# Patient Record
Sex: Male | Born: 1990 | Race: Black or African American | Hispanic: No | Marital: Single | Smoking: Heavy tobacco smoker
Health system: Southern US, Community
[De-identification: ages and names within clinical notes are randomized; demographics above are authoritative.]

## PROBLEM LIST (undated history)

## (undated) DIAGNOSIS — B20 Human immunodeficiency virus [HIV] disease: Secondary | ICD-10-CM

## (undated) DIAGNOSIS — Z21 Asymptomatic human immunodeficiency virus [HIV] infection status: Secondary | ICD-10-CM

---

## 2011-03-02 ENCOUNTER — Emergency Department (HOSPITAL_COMMUNITY)
Admission: EM | Admit: 2011-03-02 | Discharge: 2011-03-03 | Disposition: A | Discharge status: Home / Self Care | Payer: PRIVATE HEALTH INSURANCE | Attending: Emergency Medicine | Admitting: Emergency Medicine

## 2011-03-02 DIAGNOSIS — R509 Fever, unspecified: Secondary | ICD-10-CM | POA: Insufficient documentation

## 2011-03-02 DIAGNOSIS — R599 Enlarged lymph nodes, unspecified: Secondary | ICD-10-CM | POA: Insufficient documentation

## 2011-03-02 DIAGNOSIS — R1031 Right lower quadrant pain: Secondary | ICD-10-CM | POA: Insufficient documentation

## 2011-03-02 DIAGNOSIS — R000 Tachycardia, unspecified: Secondary | ICD-10-CM | POA: Insufficient documentation

## 2011-03-02 DIAGNOSIS — N498 Inflammatory disorders of other specified male genital organs: Secondary | ICD-10-CM | POA: Insufficient documentation

## 2011-03-02 DIAGNOSIS — Z21 Asymptomatic human immunodeficiency virus [HIV] infection status: Secondary | ICD-10-CM | POA: Insufficient documentation

## 2011-03-03 ENCOUNTER — Emergency Department (HOSPITAL_COMMUNITY): Payer: PRIVATE HEALTH INSURANCE

## 2011-03-03 ENCOUNTER — Encounter (HOSPITAL_COMMUNITY): Payer: Self-pay

## 2011-03-03 LAB — BASIC METABOLIC PANEL
BUN: 8 mg/dL (ref 6–23)
CO2: 26 mEq/L (ref 19–32)
Chloride: 99 mEq/L (ref 96–112)
GFR calc non Af Amer: >60 mL/min (ref >60)
Glucose, Bld: 77 mg/dL (ref 70–99)
Potassium: 3.7 mEq/L (ref 3.5–5.1)
Sodium: 135 mEq/L (ref 135–145)

## 2011-03-03 LAB — CBC
HCT: 39.9 % (ref 39.0–52.0)
Hemoglobin: 13 g/dL (ref 13.0–17.0)
MCV: 69.3 fL — ABNORMAL LOW (ref 78.0–100.0)
RBC: 5.76 MIL/uL (ref 4.22–5.81)
RDW: 13.7 % (ref 11.5–15.5)
WBC: 13.3 10*3/uL — ABNORMAL HIGH (ref 4.0–10.5)

## 2011-03-03 LAB — DIFFERENTIAL
Basophils Relative: 0 % (ref 0–1)
Eosinophils Relative: 1 % (ref 0–5)
Monocytes Absolute: 1.7 10*3/uL — ABNORMAL HIGH (ref 0.1–1.0)
Monocytes Relative: 13 % — ABNORMAL HIGH (ref 3–12)
Neutrophils Relative %: 62 % (ref 43–77)

## 2011-03-03 MED ORDER — IOHEXOL 300 MG/ML  SOLN
100.0000 mL | Freq: Once | INTRAMUSCULAR | Status: AC | PRN
  Administered 2011-03-03: 100 mL via INTRAVENOUS

## 2011-03-06 LAB — CULTURE, ROUTINE-ABSCESS

## 2011-03-07 LAB — HIV 1/2 CONFIRMATION: HIV-1 antibody: POSITIVE

## 2011-03-09 NOTE — Consult Note (Signed)
NAMEVALDEZ, Ryan         ACCOUNT NO.:  [id number]  MEDICAL RECORD NO.:  [id number]  LOCATION:  [facility]LED                         FACILITY:  [facility]  PHYSICIAN:  Valetta Fuller, MD    DATE OF BIRTH:  1990-11-22  DATE OF CONSULTATION: DATE OF DISCHARGE:                                CONSULTATION   REASON FOR CONSULTATION:  Scrotal abscess.  HISTORY OF PRESENT ILLNESS:  [name] is 20 years of age.  He tells me that he normally enjoys good health, but apparently has had some "boils" that have spontaneously ruptured in the past.  He presented with 4-5 days of some right-sided scrotal swelling with discomfort, primarily in the last 24 hours.  The patient had fever to 101.4 degrees. No voiding complaints.  He was seen by the emergency room physician who felt that he did have some groin/scrotal induration.  This was primarily at the lateral aspect of his scrotum on the right.  Concerned that there may be an underlying abscess.  Pelvic CT was performed.  The official reading is not yet available, but it does appear that there is a small fluid collection in this area, consistent with an abscess.  The ER physician did not feel comfortable incising this.  Of note, the patient apparently has been diagnosed today with being HIV positive and does apparently have some behaviors that do put him at high risk.  This is a new diagnosis for him.  Of note, his white blood cell count was actually elevated at 13,300.  PAST MEDICAL HISTORY:  Otherwise unremarkable.  No regular medications.  ALLERGIES:  No allergies.  REVIEW OF SYSTEMS:  Otherwise negative.  PHYSICAL EXAMINATION:  GENERAL:  The patient is a well-developed and well-nourished male.  He was quite comfortable, did not appear to be in any distress, and was certainly nontoxic in appearance. VITAL SIGNS:  Temperature was 101.4 degrees.  Blood pressure 140/78 with pulse of 112.  Respiratory effort was normal.  HEART:  Regular  rate and rhythm. ABDOMEN:  Soft, nontender without obvious palpable masses. GU:  Examination of the scrotum revealed approximately a 3 x 3 cm area of induration on the lateral aspect on the right side with a small amount of underlying fluctuance.  No evidence of necrotic process.  This was well removed from the testicular and adnexal structures and both testes and epididymidis palpably were completely normal bilaterally.  PROCEDURE:  The patient underwent discussion about the need for at least minimal incision of this area to rule out some underlying purulence and the rationale for incision and drainage with abscesses.  The patient gave his verbal consent for this.  This area at the junction of the scrotum and groin was prepped and draped in usual manner.  Skin was infiltrated with lidocaine.  A small incision was made in the skin which is noted to be somewhat edematous and indurated.  A small amount of purulent material was obtained and culture was obtained.  The wound was copiously irrigated.  Iodoform packing was then placed.  DISPOSITION:  The patient has had drainage of a small scrotal abscess. We will leave it to the emergency department to place him on antibiotics which will probably be  doxycycline along with pain medication.  Followup will be arranged either through the emergency room or our office for wound check.  They will also discuss with him referral to Infectious Disease for the new diagnosis of HIV positivity.     Valetta Fuller, MD     DSG/MEDQ  D:  03/03/2011  T:  03/03/2011  Job:  [id number]  Electronically Signed by Barron Alvine M.D. on 03/09/2011 06:48:19 PM

## 2011-05-13 ENCOUNTER — Ambulatory Visit (INDEPENDENT_AMBULATORY_CARE_PROVIDER_SITE_OTHER): Payer: PRIVATE HEALTH INSURANCE

## 2011-05-13 DIAGNOSIS — Z23 Encounter for immunization: Secondary | ICD-10-CM

## 2011-05-13 DIAGNOSIS — B20 Human immunodeficiency virus [HIV] disease: Secondary | ICD-10-CM

## 2011-05-14 LAB — CBC WITH DIFFERENTIAL/PLATELET
Basophils Relative: 0 % (ref 0–1)
Eosinophils Absolute: 0.1 10*3/uL (ref 0.0–0.7)
Eosinophils Relative: 2 % (ref 0–5)
Lymphs Abs: 2.5 10*3/uL (ref 0.7–4.0)
MCH: 22.7 pg — ABNORMAL LOW (ref 26.0–34.0)
MCHC: 32.6 g/dL (ref 30.0–36.0)
MCV: 69.6 fL — ABNORMAL LOW (ref 78.0–100.0)
Neutrophils Relative %: 44 % (ref 43–77)
Platelets: 233 10*3/uL (ref 150–400)
RBC: 6.26 MIL/uL — ABNORMAL HIGH (ref 4.22–5.81)

## 2011-05-14 LAB — COMPLETE METABOLIC PANEL WITH GFR
ALT: 16 U/L (ref 0–53)
Alkaline Phosphatase: 74 U/L (ref 39–117)
CO2: 25 mEq/L (ref 19–32)
Creat: 1.02 mg/dL (ref 0.50–1.35)
GFR, Est African American: >89 mL/min
GFR, Est Non African American: >89 mL/min
Glucose, Bld: 73 mg/dL (ref 70–99)
Total Bilirubin: 1 mg/dL (ref 0.3–1.2)

## 2011-05-14 LAB — URINALYSIS
Glucose, UA: NEGATIVE mg/dL
Hgb urine dipstick: NEGATIVE
Ketones, ur: NEGATIVE mg/dL
pH: 7 (ref 5.0–8.0)

## 2011-05-14 LAB — HIV ANTIBODY (ROUTINE TESTING W REFLEX): HIV: REACTIVE

## 2011-05-14 LAB — LIPID PANEL
HDL: 40 mg/dL (ref >39)
LDL Cholesterol: 90 mg/dL (ref 0–99)
Total CHOL/HDL Ratio: 3.6 Ratio
VLDL: 15 mg/dL (ref 0–40)

## 2011-05-14 LAB — HEPATITIS B SURFACE ANTIGEN: Hepatitis B Surface Ag: NEGATIVE

## 2011-05-14 LAB — HEPATITIS B CORE ANTIBODY, TOTAL: Hep B Core Total Ab: NEGATIVE

## 2011-05-14 LAB — HEPATITIS B SURFACE ANTIBODY,QUALITATIVE: Hep B S Ab: NEGATIVE

## 2011-05-15 LAB — HIV-1 RNA ULTRAQUANT REFLEX TO GENTYP+: HIV 1 RNA Quant: 41400 copies/mL — ABNORMAL HIGH (ref <20)

## 2011-05-16 DIAGNOSIS — B20 Human immunodeficiency virus [HIV] disease: Secondary | ICD-10-CM | POA: Insufficient documentation

## 2011-05-16 LAB — HIV 1/2 CONFIRMATION
HIV-1 antibody: POSITIVE — AB
HIV-2 Ab: NEGATIVE

## 2011-05-16 NOTE — Progress Notes (Signed)
Bilateral hand peeling present during intake,(dry scaling) . Pt states it has been present for at least one month and was diagnosed by ED as hand foot and mouth disease. Pt states this is the only place the peeling occurs.  No rashes or peeling present on body per patient report.  Pt refused flu vaccine  Ryan Morrow, RN IV

## 2011-05-26 LAB — HIV-1 GENOTYPR PLUS

## 2011-05-27 ENCOUNTER — Ambulatory Visit: Payer: PRIVATE HEALTH INSURANCE

## 2011-05-27 ENCOUNTER — Encounter: Payer: Self-pay | Admitting: Internal Medicine

## 2011-05-27 ENCOUNTER — Ambulatory Visit (INDEPENDENT_AMBULATORY_CARE_PROVIDER_SITE_OTHER): Payer: Self-pay | Admitting: Internal Medicine

## 2011-05-27 VITALS — BP 146/87 | HR 67 | Temp 97.5°F | Ht 70.0 in | Wt 190.0 lb

## 2011-05-27 DIAGNOSIS — Z23 Encounter for immunization: Secondary | ICD-10-CM

## 2011-05-27 DIAGNOSIS — B20 Human immunodeficiency virus [HIV] disease: Secondary | ICD-10-CM

## 2011-05-27 DIAGNOSIS — Z21 Asymptomatic human immunodeficiency virus [HIV] infection status: Secondary | ICD-10-CM

## 2011-05-27 DIAGNOSIS — R51 Headache: Secondary | ICD-10-CM

## 2011-05-27 MED ORDER — AMITRIPTYLINE HCL 25 MG PO TABS: 25.0000 mg | ORAL_TABLET | Freq: Every day | ORAL | Status: DC

## 2011-05-27 MED ORDER — EMTRICITAB-RILPIVIR-TENOFOV DF 200-25-300 MG PO TABS: 1.0000 | ORAL_TABLET | Freq: Every day | ORAL | Status: DC

## 2011-05-27 NOTE — Progress Notes (Signed)
HIV CLINIC INITIAL VISIT  RFV: establish care after being diagnosed in ED  Subjective:    Patient ID: Ryan Martinez, male    DOB: 1991-05-11, 20 y.o.   MRN: [id number]  HPI Rigdon is a 20 yo AAM with newly diagnosed HIV disease, CD 4 count 580(25%)/ viral load 41,4000, ART naive. His RF include MSM, no IVDU. He states that he previously tested negative within the last year. He has had a new partner since Feb2012, the only person that he has had unprotected sex with. He presented to ED on 03/02/11 for scrotal abscess, which was MRSA+, treated with bactrim. He took his antibiotics without difficulty. He was tested for HIV which was positive rapid test. He has subsequently been referred to [facility] to engage in care. The patient is currently still with the same partner, who he has disclosed his HIV status to, and has asked him to get tested, (not done yet). Orland feels that his partner may already know that his partner is positive, too. Dametrius has not disclosed his status to his family or other friends. He is still processing his disease and wants to start therapy.  Patient's only significant past medical hx is chronic headaches. He gets them 2-3 x per week, takes OTC NSAIDS occasionally. Has had them since he was 55 yo. His mother also has had HA as a child.   All: morphine --rash  Meds: none  Past Medical History: Chronic headache MRSA scrotal abscess Hand-Foot-Mouth disease  SH: lives with his partner. Smokes 1/2 PPD < 20yr. He is interested in quiting. occassional smoking. No illicit drug use. Previously was a Artist, quite 2 months ago. He states that he is not close with his family, but they do reside in [place].  FH: headaches, mother with sickle cell trait, he is unsure of his own status  Review of Systems Review of Systems  Constitutional: Negative for fever, chills, diaphoresis, activity change, appetite change, fatigue and unexpected weight change.  HENT: Negative for  congestion, sore throat, rhinorrhea, sneezing, trouble swallowing and sinus pressure.  Eyes: Negative for photophobia and visual disturbance.  Respiratory: Negative for cough, chest tightness, shortness of breath, wheezing and stridor.  Cardiovascular: Negative for chest pain, palpitations and leg swelling.  Gastrointestinal: Negative for nausea, vomiting, abdominal pain, diarrhea, constipation, blood in stool, abdominal distention and anal bleeding.  Genitourinary: Negative for dysuria, hematuria, flank pain and difficulty urinating.  Musculoskeletal: Negative for myalgias, back pain, joint swelling, arthralgias and gait problem.  Skin: Negative for color change, pallor, rash and wound.  Neurological: Negative for dizziness, tremors, weakness and light-headedness.  Hematological: Negative for adenopathy. Does not bruise/bleed easily.  Psychiatric/Behavioral: Negative for behavioral problems, confusion, sleep disturbance, dysphoric mood, decreased concentration and agitation.       Objective:   Physical Exam BP 146/87  Pulse 67  Temp(Src) 97.5 F (36.4 C) (Oral)  Ht 5\' 10"  (1.778 m)  Wt 190 lb (86.183 kg)  BMI 27.26 kg/m2   General Appearance:    Alert, cooperative, no distress, appears stated age  Head:    Normocephalic, without obvious abnormality, atraumatic  Eyes:    PERRL, conjunctiva/corneas clear, EOM's intact, fundi    benign, both eyes       Ears:    Normal TM's and external ear canals, both ears  Nose:   Nares normal, septum midline, mucosa normal, no drainage   or sinus tenderness  Throat:   Lips, mucosa, and tongue normal; teeth and gums normal  Neck:   Supple, symmetrical, trachea midline, no adenopathy;       thyroid:  No enlargement/tenderness/nodules; no carotid   bruit or JVD  Back:     Symmetric, no curvature, ROM normal, no CVA tenderness  Lungs:     Clear to auscultation bilaterally, respirations unlabored  Chest wall:    No tenderness or deformity  Heart:     Regular rate and rhythm, S1 and S2 normal, no murmur, rub   or gallop  Abdomen:     Soft, non-tender, bowel sounds active all four quadrants,    no masses, no organomegaly        Extremities:   Extremities normal, atraumatic, no cyanosis or edema  Pulses:   2+ and symmetric all extremities  Skin:   Skin color, texture, turgor normal, no rashes or lesions  Lymph nodes:   Cervical, supraclavicular, and axillary nodes normal  Neurologic:   CNII-XII intact. Normal strength, sensation and reflexes      throughout         Assessment & Plan:  HIV = will apply for ADAP. Will start with complera. Patient is not interested at this time to enroll in clinical trials.  Health maintenance = he is to get hep B vax #1, he declined to get influenza vax  Smoking cessation = patient is interested in quiting smoking. Will check in 1 month to see if he has decreased usage  HIV prevention = he is currently not sexually active with his partner. He is instructed to use condoms since he is at risk of transmitting to others since he has HIV with high viral load  Chronic headache = will try amitriptyline 25mg  QHS to see if helps his symptoms  rtc in 1 month

## 2011-07-01 ENCOUNTER — Encounter: Payer: Self-pay | Admitting: Internal Medicine

## 2011-07-01 ENCOUNTER — Ambulatory Visit (INDEPENDENT_AMBULATORY_CARE_PROVIDER_SITE_OTHER): Payer: Medicaid Other | Admitting: Internal Medicine

## 2011-07-01 ENCOUNTER — Ambulatory Visit: Payer: Self-pay

## 2011-07-01 VITALS — BP 149/87 | HR 81 | Temp 98.8°F | Wt 195.0 lb

## 2011-07-01 DIAGNOSIS — Z0000 Encounter for general adult medical examination without abnormal findings: Secondary | ICD-10-CM

## 2011-07-01 DIAGNOSIS — B20 Human immunodeficiency virus [HIV] disease: Secondary | ICD-10-CM

## 2011-07-01 DIAGNOSIS — Z8614 Personal history of Methicillin resistant Staphylococcus aureus infection: Secondary | ICD-10-CM

## 2011-07-01 DIAGNOSIS — Z23 Encounter for immunization: Secondary | ICD-10-CM

## 2011-07-01 DIAGNOSIS — Z21 Asymptomatic human immunodeficiency virus [HIV] infection status: Secondary | ICD-10-CM

## 2011-07-01 MED ORDER — EMTRICITAB-RILPIVIR-TENOFOV DF 200-25-300 MG PO TABS: 1.0000 | ORAL_TABLET | Freq: Every day | ORAL | Status: DC

## 2011-07-02 ENCOUNTER — Other Ambulatory Visit: Payer: Self-pay | Admitting: *Deleted

## 2011-07-02 DIAGNOSIS — B20 Human immunodeficiency virus [HIV] disease: Secondary | ICD-10-CM

## 2011-07-02 MED ORDER — EMTRICITAB-RILPIVIR-TENOFOV DF 200-25-300 MG PO TABS: 1.0000 | ORAL_TABLET | Freq: Every day | ORAL | Status: DC

## 2011-07-07 DIAGNOSIS — Z8614 Personal history of Methicillin resistant Staphylococcus aureus infection: Secondary | ICD-10-CM | POA: Insufficient documentation

## 2011-07-07 NOTE — Progress Notes (Signed)
HIV CLINIC NOTE  RFV: routine follow up Subjective:    Patient ID: Ryan Martinez, male    DOB: [birth year], 21 y.o.   MRN: [id number]  HPIMarques is a 21 yo AAM with newly diagnosed HIV disease, CD 4 count 580(25%)/ viral load 41,4000, ART naive. His RF include MSM, no IVDU. He states that he previously tested negative within the last year. He has had a new partner since Feb2012, the only person that he has had unprotected sex with. He presented to ED on 03/02/11 for scrotal abscess, which was MRSA+, treated with bactrim. He took his antibiotics without difficulty. He was tested for HIV which was positive rapid test.  Patient last seen one month ago, processing his newly diagnosed status. He is ready to initiate therapy. Have offered option to doing clinical trials, but he would like to start ART immediately. We reviewed the importance of not missing any doses, taking his medications routinely.  He states that he was taking amitriptyline for his occasional headaches which are now improved. He still has not disclosed his HIV status to anyone other than his roommate.  Active Ambulatory Problems    Diagnosis Date Noted  . HIV infection, symptomatic, maternal, antepartum 05/16/2011   Resolved Ambulatory Problems    Diagnosis Date Noted  . No Resolved Ambulatory Problems   No Additional Past Medical History   Prior to Admission medications   Medication Sig Start Date End Date Taking? Authorizing Provider  amitriptyline (ELAVIL) 25 MG tablet Take 1 tablet (25 mg total) by mouth at bedtime. 05/27/11 05/26/12 Yes Judyann Munson, MD  Emtricitab-Rilpivir-Tenofovir (COMPLERA) 200-25-300 MG TABS Take 1 tablet by mouth daily. 07/02/11   Judyann Munson, MD     Review of Systems Constitutional: Negative for fever, chills, diaphoresis, activity change, appetite change, fatigue and unexpected weight change.  HENT: Negative for congestion, sore throat, rhinorrhea, sneezing, trouble swallowing and  sinus pressure.  Eyes: Negative for photophobia and visual disturbance.  Respiratory: Negative for cough, chest tightness, shortness of breath, wheezing and stridor.  Cardiovascular: Negative for chest pain, palpitations and leg swelling.  Gastrointestinal: Negative for nausea, vomiting, abdominal pain, diarrhea, constipation, blood in stool, abdominal distention and anal bleeding.  Genitourinary: Negative for dysuria, hematuria, flank pain and difficulty urinating.  Musculoskeletal: Negative for myalgias, back pain, joint swelling, arthralgias and gait problem.  Skin: Negative for color change, pallor, rash and wound.  Neurological: Negative for dizziness, tremors, weakness and light-headedness.  Hematological: Negative for adenopathy. Does not bruise/bleed easily.  Psychiatric/Behavioral: Negative for behavioral problems, confusion, sleep disturbance, dysphoric mood, decreased concentration and agitation.        Objective:   Physical Exam BP 149/87  Pulse 81  Temp(Src) 98.8 F (37.1 C) (Oral)  Wt 195 lb (88.451 kg)  General Appearance:    Alert, cooperative, no distress, appears stated age  Head:    Normocephalic, without obvious abnormality, atraumatic  Eyes:    PERRL, conjunctiva/corneas clear, EOM's intact,   Ears:    Normal TM's and external ear canals, both ears  Nose:   Nares normal, septum midline, mucosa normal, no drainage   or sinus tenderness  Throat:   Lips, mucosa, and tongue normal; teeth and gums normal  Neck:   Supple, symmetrical, trachea midline, no adenopathy;        Back:     Symmetric, no curvature, ROM normal, no CVA tenderness  Lungs:     Clear to auscultation bilaterally, respirations unlabored     Heart:  Regular rate and rhythm, S1 and S2 normal, no murmur, rub   or gallop  Abdomen:     Soft, non-tender, bowel sounds active all four quadrants,    no masses, no organomegaly        Extremities:   Extremities normal, atraumatic, no cyanosis or edema   Pulses:   2+ and symmetric all extremities  Skin:   Skin color, texture, turgor normal, no rashes or lesions  Lymph nodes:   Cervical, supraclavicular, and axillary nodes normal           Assessment & Plan:  HIV= will start patient on complera. 1 tablet daily. Have him come back in 1 month to repeat labs.  Health maintenance= patient to received 2nd dose of hepatitis B vaccination today  Smoking cessation = pre-contemplative  RTC in 1 month

## 2011-08-07 ENCOUNTER — Telehealth: Payer: Self-pay | Admitting: *Deleted

## 2011-08-07 ENCOUNTER — Ambulatory Visit: Payer: Medicaid Other | Admitting: Internal Medicine

## 2011-08-07 NOTE — Telephone Encounter (Signed)
Called patient and he said he was going to have to call back and reschedule his appt because he was at work. Reminded him about the importance of seeing the doctor regularly and encouraged him to call and reschedule asap.

## 2011-12-04 ENCOUNTER — Emergency Department (HOSPITAL_COMMUNITY)
Admission: EM | Admit: 2011-12-04 | Discharge: 2011-12-04 | Disposition: A | Discharge status: Home / Self Care | Payer: Medicaid Other | Attending: Emergency Medicine | Admitting: Emergency Medicine

## 2011-12-04 ENCOUNTER — Encounter (HOSPITAL_COMMUNITY): Payer: Self-pay | Admitting: *Deleted

## 2011-12-04 DIAGNOSIS — L0291 Cutaneous abscess, unspecified: Secondary | ICD-10-CM

## 2011-12-04 DIAGNOSIS — F17200 Nicotine dependence, unspecified, uncomplicated: Secondary | ICD-10-CM | POA: Insufficient documentation

## 2011-12-04 DIAGNOSIS — M79609 Pain in unspecified limb: Secondary | ICD-10-CM | POA: Insufficient documentation

## 2011-12-04 DIAGNOSIS — L03119 Cellulitis of unspecified part of limb: Secondary | ICD-10-CM | POA: Insufficient documentation

## 2011-12-04 DIAGNOSIS — L02419 Cutaneous abscess of limb, unspecified: Secondary | ICD-10-CM | POA: Insufficient documentation

## 2011-12-04 DIAGNOSIS — L0390 Cellulitis, unspecified: Secondary | ICD-10-CM

## 2011-12-04 MED ORDER — SULFAMETHOXAZOLE-TRIMETHOPRIM 800-160 MG PO TABS: 1.0000 | ORAL_TABLET | Freq: Two times a day (BID) | ORAL | Status: AC

## 2011-12-04 MED ORDER — SULFAMETHOXAZOLE-TMP DS 800-160 MG PO TABS
1.0000 | ORAL_TABLET | Freq: Once | ORAL | Status: AC
  Administered 2011-12-04: 1 via ORAL
  Filled 2011-12-04: qty 1

## 2011-12-04 MED ORDER — OXYCODONE-ACETAMINOPHEN 5-325 MG PO TABS
2.0000 | ORAL_TABLET | Freq: Once | ORAL | Status: AC
  Administered 2011-12-04: 2 via ORAL
  Filled 2011-12-04: qty 2

## 2011-12-04 MED ORDER — IBUPROFEN 200 MG PO TABS
400.0000 mg | ORAL_TABLET | Freq: Once | ORAL | Status: AC
  Administered 2011-12-04: 400 mg via ORAL
  Filled 2011-12-04: qty 1
  Filled 2011-12-04: qty 2

## 2011-12-04 NOTE — Discharge Instructions (Signed)
Abscess An abscess (boil or furuncle) is an infected area that contains a collection of pus.  SYMPTOMS Signs and symptoms of an abscess include pain, tenderness, redness, or hardness. You may feel a moveable soft area under your skin. An abscess can occur anywhere in the body.  TREATMENT  A surgical cut (incision) may be made over your abscess to drain the pus. Gauze may be packed into the space or a drain may be looped through the abscess cavity (pocket). This provides a drain that will allow the cavity to heal from the inside outwards. The abscess may be painful for a few days, but should feel much better if it was drained.  Your abscess, if seen early, may not have localized and may not have been drained. If not, another appointment may be required if it does not get better on its own or with medications. HOME CARE INSTRUCTIONS   Only take over-the-counter or prescription medicines for pain, discomfort, or fever as directed by your caregiver.   Take your antibiotics as directed if they were prescribed. Finish them even if you start to feel better.   Keep the skin and clothes clean around your abscess.   If the abscess was drained, you will need to use gauze dressing to collect any draining pus. Dressings will typically need to be changed 3 or more times a day.   The infection may spread by skin contact with others. Avoid skin contact as much as possible.   Practice good hygiene. This includes regular hand washing, cover any draining skin lesions, and do not share personal care items.   If you participate in sports, do not share athletic equipment, towels, whirlpools, or personal care items. Shower after every practice or tournament.   If a draining area cannot be adequately covered:   Do not participate in sports.   Children should not participate in day care until the wound has healed or drainage stops.   If your caregiver has given you a follow-up appointment, it is very important  to keep that appointment. Not keeping the appointment could result in a much worse infection, chronic or permanent injury, pain, and disability. If there is any problem keeping the appointment, you must call back to this facility for assistance.  SEEK MEDICAL CARE IF:   You develop increased pain, swelling, redness, drainage, or bleeding in the wound site.   You develop signs of generalized infection including muscle aches, chills, fever, or a general ill feeling.   You have an oral temperature above 102 F (38.9 C).  MAKE SURE YOU:   Understand these instructions.   Will watch your condition.   Will get help right away if you are not doing well or get worse.  Document Released: 03/05/2005 Document Revised: 05/15/2011 Document Reviewed: 12/28/2007 [facility] Patient Information 2012 [place], [place].Cellulitis Cellulitis is an infection of the tissue under the skin. The infected area is usually red and tender. This is caused by germs. These germs enter the body through cuts or sores. This usually happens in the arms or lower legs. HOME CARE   Take your medicine as told. Finish it even if you start to feel better.   If the infection is on the arm or leg, keep it raised (elevated).   Use a warm cloth on the infected area several times a day.   See your doctor for a follow-up visit as told.  GET HELP RIGHT AWAY IF:   You are tired or confused.   You  throw up (vomit).   You have watery poop (diarrhea).   You feel ill and have muscle aches.   You have a fever.  MAKE SURE YOU:   Understand these instructions.   Will watch your condition.   Will get help right away if you are not doing well or get worse.  Document Released: 11/12/2007 Document Revised: 05/15/2011 Document Reviewed: 04/27/2009 [facility] Patient Information 2012 [place], [place].Abscess Care After An abscess (also called a boil or furuncle) is an infected area that contains a collection of pus. Signs and symptoms  of an abscess include pain, tenderness, redness, or hardness, or you may feel a moveable soft area under your skin. An abscess can occur anywhere in the body. The infection may spread to surrounding tissues causing cellulitis. A cut (incision) by the surgeon was made over your abscess and the pus was drained out. Gauze may have been packed into the space to provide a drain that will allow the cavity to heal from the inside outwards. The boil may be painful for 5 to 7 days. Most people with a boil do not have high fevers. Your abscess, if seen early, may not have localized, and may not have been lanced. If not, another appointment may be required for this if it does not get better on its own or with medications. HOME CARE INSTRUCTIONS   Only take over-the-counter or prescription medicines for pain, discomfort, or fever as directed by your caregiver.   When you bathe, soak and then remove gauze or iodoform packs at least daily or as directed by your caregiver. You may then wash the wound gently with mild soapy water. Repack with gauze or do as your caregiver directs.  SEEK IMMEDIATE MEDICAL CARE IF:   You develop increased pain, swelling, redness, drainage, or bleeding in the wound site.   You develop signs of generalized infection including muscle aches, chills, fever, or a general ill feeling.   An oral temperature above 102 F (38.9 C) develops, not controlled by medication.  See your caregiver for a recheck if you develop any of the symptoms described above. If medications (antibiotics) were prescribed, take them as directed. Document Released: 12/12/2004 Document Revised: 05/15/2011 Document Reviewed: 08/09/2007 [facility] Patient Information 2012 [place], [place].

## 2011-12-04 NOTE — ED Notes (Signed)
Pt states he started to noticed two abscess on his left inner thigh. Pt denies any drainage

## 2011-12-11 NOTE — ED Provider Notes (Signed)
History    21yM with painful lesions to L thigh. Gradual onset. Worsening. Noticed about 2d ago. No trauma. No drainage. NO fever or chills. No n/v. Denies hx of diabetes. No urinary complaints.   CSN: [id number]  Arrival date & time 12/04/11  2115   First MD Initiated Contact with Patient 12/04/11 2234      Chief Complaint  Patient presents with  . Abscess    (Consider location/radiation/quality/duration/timing/severity/associated sxs/prior treatment) HPI  History reviewed. No pertinent past medical history.  History reviewed. No pertinent past surgical history.  Family History  Problem Relation Age of Onset  . Sarcoidosis Mother     History  Substance Use Topics  . Smoking status: Current Everyday Smoker -- 0.5 packs/day for 1 years    Types: Cigarettes  . Smokeless tobacco: Never Used  . Alcohol Use: 0.0 oz/week    1-2 Shots of liquor per week      Review of Systems   Review of symptoms negative unless otherwise noted in HPI.   Allergies  Morphine and related  Home Medications   Current Outpatient Rx  Name Route Sig Dispense Refill  . SULFAMETHOXAZOLE-TRIMETHOPRIM 800-160 MG PO TABS Oral Take 1 tablet by mouth every 12 (twelve) hours. 10 tablet 0    BP 159/93  Pulse 122  Temp 98.8 F (37.1 C)  Resp 20  SpO2 98%  Physical Exam  Nursing note and vitals reviewed. Constitutional: He appears well-developed and well-nourished. No distress.  HENT:  Head: Normocephalic and atraumatic.  Eyes: Conjunctivae are normal. Right eye exhibits no discharge. Left eye exhibits no discharge.  Neck: Neck supple.  Cardiovascular: Normal rate, regular rhythm and normal heart sounds.  Exam reveals no gallop and no friction rub.   No murmur heard. Pulmonary/Chest: Effort normal and breath sounds normal. No respiratory distress.  Abdominal: Soft. He exhibits no distension. There is no tenderness.  Musculoskeletal: He exhibits no edema and no tenderness.  Neurological:  He is alert.  Skin: Skin is warm and dry.       2 discrete lesions consistent with abscess to proximal L medial thigh. Both ~3cm in size. One coming to head with small amount of spontaneous purulent drainage. Fluctuant. Tender. Small area of surrounding cellulitis. Neurovascularly intact distally.  Psychiatric: He has a normal mood and affect. His behavior is normal. Thought content normal.    ED Course  Procedures (including critical care time)  INCISION AND DRAINAGE Performed by: Raeford Razor Consent: Verbal consent obtained. Risks and benefits: risks, benefits and alternatives were discussed Type: abscess  Body area: L medial thigh  Anesthesia: local infiltration  Local anesthetic: lidocaine 2% w epinephrine  Anesthetic total: 2 ml  Complexity: complex Blunt dissection to break up loculations  Drainage: purulent  Drainage amount: moderate   Packing material: 1/4 in iodoform gauze  Patient tolerance: Patient tolerated the procedure well with no immediate complications.  INCISION AND DRAINAGE Performed by: Raeford Razor Consent: Verbal consent obtained. Risks and benefits: risks, benefits and alternatives were discussed Type: abscess  Body area: medial L thigh  Anesthesia: local infiltration  Local anesthetic: lidocaine 2% w epinephrine  Anesthetic total: 2 ml  Complexity: complex Blunt dissection to break up loculations  Drainage: purulent  Drainage amount: moderate  Packing material: 1/4 in iodoform gauze  Patient tolerance: Patient tolerated the procedure well with no immediate complications.      Labs Reviewed - No data to display No results found.   1. Abscess   2. Cellulitis  MDM  21yM with two abscesses to L thigh. I&D'd. Will give course of abx for surrounding cellulitis. Afebrile and well appearing. Not diabetic or other immunocompromising factors. Feel outpt abx appropriate at this time. Continued wound care and return  precautions discussed.        Raeford Razor, MD 12/11/11 [phone]

## 2012-01-23 ENCOUNTER — Other Ambulatory Visit: Payer: Self-pay | Admitting: Licensed Clinical Social Worker

## 2012-01-23 DIAGNOSIS — B20 Human immunodeficiency virus [HIV] disease: Secondary | ICD-10-CM

## 2012-01-26 ENCOUNTER — Telehealth: Payer: Self-pay | Admitting: *Deleted

## 2012-01-26 ENCOUNTER — Other Ambulatory Visit: Payer: Medicaid Other

## 2012-01-26 NOTE — Telephone Encounter (Signed)
Left message to call [facility] for new Lab Work appt.  Ph. # given.

## 2012-02-11 ENCOUNTER — Ambulatory Visit: Payer: Medicaid Other | Admitting: Internal Medicine

## 2012-06-01 ENCOUNTER — Emergency Department (HOSPITAL_COMMUNITY)
Admission: EM | Admit: 2012-06-01 | Discharge: 2012-06-01 | Disposition: A | Discharge status: Home / Self Care | Payer: Self-pay | Attending: Emergency Medicine | Admitting: Emergency Medicine

## 2012-06-01 ENCOUNTER — Encounter (HOSPITAL_COMMUNITY): Payer: Self-pay | Admitting: Emergency Medicine

## 2012-06-01 DIAGNOSIS — B029 Zoster without complications: Secondary | ICD-10-CM | POA: Insufficient documentation

## 2012-06-01 DIAGNOSIS — F17200 Nicotine dependence, unspecified, uncomplicated: Secondary | ICD-10-CM | POA: Insufficient documentation

## 2012-06-01 DIAGNOSIS — R238 Other skin changes: Secondary | ICD-10-CM | POA: Insufficient documentation

## 2012-06-01 DIAGNOSIS — R079 Chest pain, unspecified: Secondary | ICD-10-CM | POA: Insufficient documentation

## 2012-06-01 HISTORY — DX: Human immunodeficiency virus (HIV) disease: B20

## 2012-06-01 HISTORY — DX: Asymptomatic human immunodeficiency virus (hiv) infection status: Z21

## 2012-06-01 MED ORDER — ACYCLOVIR 400 MG PO TABS: 400.0000 mg | ORAL_TABLET | Freq: Four times a day (QID) | ORAL | Status: DC

## 2012-06-01 MED ORDER — OXYCODONE-ACETAMINOPHEN 5-325 MG PO TABS: 1.0000 | ORAL_TABLET | Freq: Four times a day (QID) | ORAL | Status: DC | PRN

## 2012-06-01 NOTE — ED Provider Notes (Signed)
Medical screening examination/treatment/procedure(s) were performed by non-physician practitioner and as supervising physician I was immediately available for consultation/collaboration.  Kellyann Ordway K Linker, MD 06/01/12 1425 

## 2012-06-01 NOTE — ED Provider Notes (Signed)
Medical screening examination/treatment/procedure(s) were performed by non-physician practitioner and as supervising physician I was immediately available for consultation/collaboration.  Ethelda Chick, MD 06/01/12 [phone]

## 2012-06-01 NOTE — ED Notes (Signed)
Pt noticed painful rash to left side since Saturday.

## 2012-06-01 NOTE — ED Provider Notes (Addendum)
History     CSN: [id number]  Arrival date & time 06/01/12  1234   First MD Initiated Contact with Patient 06/01/12 1350      Chief Complaint  Patient presents with  . Rash  . Herpes Zoster    (Consider location/radiation/quality/duration/timing/severity/associated sxs/prior treatment) HPI  21 year old male presents c/o a rash.  Pt reports having a rash to the left side of his chest which wraps around to his back which has been ongoing x 3 days.  Onset is gradual, persistent, moderate in severity, sharp and throbbing, non itchy.  No fever, chills, sob, recent trauma.  No changes in detergent, soap, or other environmental factors.  Had chicken pox as a child. Never had similar rash before. Has hx of HIV.  No treatment tried.   History reviewed. No pertinent past medical history.  History reviewed. No pertinent past surgical history.  Family History  Problem Relation Age of Onset  . Sarcoidosis Mother     History  Substance Use Topics  . Smoking status: Current Every Day Smoker -- 0.5 packs/day for 1 years    Types: Cigarettes  . Smokeless tobacco: Never Used  . Alcohol Use: 0.0 oz/week    1-2 Shots of liquor per week      Review of Systems  Constitutional: Negative for fever and chills.  Respiratory: Negative for shortness of breath.   Cardiovascular: Positive for chest pain.  Musculoskeletal: Negative for arthralgias.  Skin: Positive for rash. Negative for wound.  Neurological: Negative for headaches.    Allergies  Morphine and related  Home Medications  No current outpatient prescriptions on file.  BP 127/73  Pulse 105  Temp 98.7 F (37.1 C) (Oral)  Resp 16  SpO2 100%  Physical Exam  Nursing note and vitals reviewed. Constitutional: He is oriented to person, place, and time. He appears well-developed and well-nourished. No distress.  HENT:  Head: Atraumatic.  Eyes: Conjunctivae normal are normal.  Neck: Neck supple.  Pulmonary/Chest:       Musculoskeletal: Normal range of motion.  Neurological: He is alert and oriented to person, place, and time.  Skin: Skin is warm.    ED Course  Procedures (including critical care time)  Labs Reviewed - No data to display No results found.   No diagnosis found.  1. Shingle, chest  MDM  Pt with hx of HIV currently not on any treatment presents with a 3 days rash consistent with shingle.  Will d/c with acyclovir and percocet.  Resources given for further management. Return precaution discussed.  Pt voice understanding and agrees with plan.    BP 127/73  Pulse 105  Temp 98.7 F (37.1 C) (Oral)  Resp 16  SpO2 100%  I have reviewed nursing notes and vital signs.  I reviewed available ER/hospitalization records thought the EMR         Fayrene Helper, PA-C 06/01/12 1416  Fayrene Helper, PA-C 06/01/12 1419

## 2012-08-14 ENCOUNTER — Encounter (HOSPITAL_COMMUNITY): Payer: Self-pay | Admitting: Emergency Medicine

## 2012-08-14 ENCOUNTER — Telehealth (HOSPITAL_COMMUNITY): Payer: Self-pay | Admitting: Emergency Medicine

## 2012-08-14 ENCOUNTER — Emergency Department (HOSPITAL_COMMUNITY)
Admission: EM | Admit: 2012-08-14 | Discharge: 2012-08-14 | Disposition: A | Discharge status: Home / Self Care | Payer: Self-pay | Attending: Emergency Medicine | Admitting: Emergency Medicine

## 2012-08-14 DIAGNOSIS — K0889 Other specified disorders of teeth and supporting structures: Secondary | ICD-10-CM

## 2012-08-14 DIAGNOSIS — R109 Unspecified abdominal pain: Secondary | ICD-10-CM | POA: Insufficient documentation

## 2012-08-14 DIAGNOSIS — Z21 Asymptomatic human immunodeficiency virus [HIV] infection status: Secondary | ICD-10-CM | POA: Insufficient documentation

## 2012-08-14 DIAGNOSIS — K089 Disorder of teeth and supporting structures, unspecified: Secondary | ICD-10-CM | POA: Insufficient documentation

## 2012-08-14 DIAGNOSIS — F17200 Nicotine dependence, unspecified, uncomplicated: Secondary | ICD-10-CM | POA: Insufficient documentation

## 2012-08-14 MED ORDER — MAGIC MOUTHWASH W/LIDOCAINE: 10.0000 mL | Freq: Three times a day (TID) | ORAL | Status: DC | PRN

## 2012-08-14 MED ORDER — TRAMADOL HCL 50 MG PO TABS: 50.0000 mg | ORAL_TABLET | Freq: Four times a day (QID) | ORAL | Status: DC | PRN

## 2012-08-14 NOTE — ED Notes (Signed)
Patient c/o gum pain throughout that began about a week ago.  Patient denies fevers.

## 2012-08-14 NOTE — ED Provider Notes (Signed)
History     CSN: [id number]  Arrival date & time 08/14/12  0807   First MD Initiated Contact with Patient 08/14/12 [phone]      Chief Complaint  Patient presents with  . Dental Pain    (Consider location/radiation/quality/duration/timing/severity/associated sxs/prior treatment) HPI  Ryan Martinez is a 22 y.o. male complaining of severe abdominal pain at the intersection with the teeth worsening over the course of 3 days. He rates his pain at 10 out of 10; he has not taken any pain medication. He states that he could not sleep last night secondary to pain he denies any fever, difficulty swallowing, lesions or plaques. Patient has HIV he is in the process of obtaining a primary care physician last CD4 count and viral is unknown.  Past Medical History  Diagnosis Date  . HIV (human immunodeficiency virus infection)     History reviewed. No pertinent past surgical history.  Family History  Problem Relation Age of Onset  . Sarcoidosis Mother     History  Substance Use Topics  . Smoking status: Current Every Day Smoker -- 0.50 packs/day for 1 years    Types: Cigarettes  . Smokeless tobacco: Never Used  . Alcohol Use: 0.0 oz/week    1-2 Shots of liquor per week      Review of Systems  Constitutional: Negative for fever.  HENT: Positive for dental problem.   Respiratory: Negative for shortness of breath.   Cardiovascular: Negative for chest pain.  Gastrointestinal: Negative for nausea, vomiting, abdominal pain and diarrhea.  All other systems reviewed and are negative.    Allergies  Morphine and related  Home Medications   Current Outpatient Rx  Name  Route  Sig  Dispense  Refill  . acyclovir (ZOVIRAX) 400 MG tablet   Oral   Take 1 tablet (400 mg total) by mouth 4 (four) times daily.   50 tablet   0   . oxyCODONE-acetaminophen (PERCOCET/ROXICET) 5-325 MG per tablet   Oral   Take 1-2 tablets by mouth every 6 (six) hours as needed for pain.   20 tablet    0     BP 151/90  Pulse 104  Temp(Src) 99 F (37.2 C) (Oral)  Resp 16  SpO2 99%  Physical Exam  Nursing note and vitals reviewed. Constitutional: He is oriented to person, place, and time. He appears well-developed and well-nourished. No distress.  HENT:  Head: Normocephalic.  Mouth/Throat: Oropharynx is clear and moist.  Dentition in generally good condition with no large dental caries. Pt is most tender on upper inner gumline: an rea with no swelling but with plague and bleeding gums.   No plaques or legions  Eyes: Conjunctivae and EOM are normal.  Cardiovascular: Normal rate.   Pulmonary/Chest: Effort normal. No stridor.  Musculoskeletal: Normal range of motion.  Lymphadenopathy:    He has no cervical adenopathy.  Neurological: He is alert and oriented to person, place, and time.  Psychiatric: He has a normal mood and affect.    ED Course  Procedures (including critical care time)  Labs Reviewed - No data to display No results found.   1. Pain, dental       MDM   Ryan Martinez is a 22 y.o. male with dental pain. No sign of infectious process. Magic mouthwash with nystatin (verbally with pharmacy) given. Advised Pt to follow with dentist and, more importantly, with PCP or ID for HIV managment   Filed Vitals:   08/14/12 0815  BP:  151/90  Pulse: 104  Temp: 99 F (37.2 C)  TempSrc: Oral  Resp: 16  SpO2: 99%     Pt verbalized understanding and agrees with care plan. Outpatient follow-up and return precautions given.    Discharge Medication List as of 08/14/2012  8:56 AM    START taking these medications   Details  Alum & Mag Hydroxide-Simeth (MAGIC MOUTHWASH W/LIDOCAINE) SOLN Take 10 mLs by mouth 3 (three) times daily as needed., Starting 08/14/2012, Until Discontinued, Print    traMADol (ULTRAM) 50 MG tablet Take 1 tablet (50 mg total) by mouth every 6 (six) hours as needed for pain., Starting 08/14/2012, Until Discontinued, [employer], PA-C 08/14/12 1610

## 2012-08-15 NOTE — ED Provider Notes (Signed)
Medical screening examination/treatment/procedure(s) were performed by non-physician practitioner and as supervising physician I was immediately available for consultation/collaboration.  Douglas Delo, MD 08/15/12 1540 

## 2012-09-02 ENCOUNTER — Other Ambulatory Visit: Payer: Self-pay | Admitting: *Deleted

## 2012-09-02 DIAGNOSIS — Z113 Encounter for screening for infections with a predominantly sexual mode of transmission: Secondary | ICD-10-CM

## 2012-09-02 DIAGNOSIS — Z79899 Other long term (current) drug therapy: Secondary | ICD-10-CM

## 2012-09-02 DIAGNOSIS — B20 Human immunodeficiency virus [HIV] disease: Secondary | ICD-10-CM

## 2012-09-08 ENCOUNTER — Other Ambulatory Visit: Payer: Self-pay | Admitting: *Deleted

## 2012-09-08 ENCOUNTER — Other Ambulatory Visit (INDEPENDENT_AMBULATORY_CARE_PROVIDER_SITE_OTHER): Payer: Self-pay

## 2012-09-08 ENCOUNTER — Other Ambulatory Visit: Payer: Self-pay | Admitting: Infectious Diseases

## 2012-09-08 ENCOUNTER — Ambulatory Visit: Payer: Medicaid Other

## 2012-09-08 ENCOUNTER — Other Ambulatory Visit: Payer: Medicaid Other

## 2012-09-08 DIAGNOSIS — B20 Human immunodeficiency virus [HIV] disease: Secondary | ICD-10-CM

## 2012-09-08 DIAGNOSIS — Z79899 Other long term (current) drug therapy: Secondary | ICD-10-CM

## 2012-09-08 DIAGNOSIS — Z113 Encounter for screening for infections with a predominantly sexual mode of transmission: Secondary | ICD-10-CM

## 2012-09-08 LAB — CBC WITH DIFFERENTIAL/PLATELET
Basophils Relative: 0 % (ref 0–1)
HCT: 41.8 % (ref 39.0–52.0)
Hemoglobin: 13.3 g/dL (ref 13.0–17.0)
MCHC: 31.8 g/dL (ref 30.0–36.0)
Monocytes Absolute: 0.7 10*3/uL (ref 0.1–1.0)
Monocytes Relative: 17 % — ABNORMAL HIGH (ref 3–12)
Neutro Abs: 1.6 10*3/uL — ABNORMAL LOW (ref 1.7–7.7)

## 2012-09-08 LAB — COMPLETE METABOLIC PANEL WITH GFR
Albumin: 4 g/dL (ref 3.5–5.2)
Alkaline Phosphatase: 86 U/L (ref 39–117)
BUN: 10 mg/dL (ref 6–23)
CO2: 28 mEq/L (ref 19–32)
Calcium: 9.2 mg/dL (ref 8.4–10.5)
Chloride: 105 mEq/L (ref 96–112)
GFR, Est African American: >89 mL/min
GFR, Est Non African American: >89 mL/min
Glucose, Bld: 75 mg/dL (ref 70–99)
Potassium: 4.2 mEq/L (ref 3.5–5.3)

## 2012-09-08 LAB — LIPID PANEL: Cholesterol: 123 mg/dL (ref 0–200)

## 2012-09-08 MED ORDER — EMTRICITAB-RILPIVIR-TENOFOV DF 200-25-300 MG PO TABS: 1.0000 | ORAL_TABLET | Freq: Every day | ORAL | Status: DC

## 2012-09-08 NOTE — Telephone Encounter (Signed)
Printed for patient ADAP application.

## 2012-09-09 LAB — HIV-1 RNA QUANT-NO REFLEX-BLD: HIV-1 RNA Quant, Log: 4.74 {Log} — ABNORMAL HIGH (ref <1.30)

## 2012-09-09 LAB — T-HELPER CELL (CD4) - (RCID CLINIC ONLY)
CD4 % Helper T Cell: 24 % — ABNORMAL LOW (ref 33–55)
CD4 T Cell Abs: 470 uL (ref 400–2700)

## 2012-09-14 ENCOUNTER — Encounter: Payer: Self-pay | Admitting: *Deleted

## 2012-09-16 ENCOUNTER — Ambulatory Visit (INDEPENDENT_AMBULATORY_CARE_PROVIDER_SITE_OTHER): Payer: Self-pay | Admitting: Internal Medicine

## 2012-09-16 VITALS — Temp 98.3°F | Wt 195.0 lb

## 2012-09-16 DIAGNOSIS — B20 Human immunodeficiency virus [HIV] disease: Secondary | ICD-10-CM

## 2012-09-16 NOTE — Progress Notes (Signed)
[facility] NOTE  RFV: routine hiv visit Subjective:    Patient ID: Ryan Martinez, male    DOB: Jun 02, 1991, 22 y.o.   MRN: [id number]  HPI [name], with  22yo AAM with newly HIV disease, CD 4 count 470/ VL 55, 580, not taking complera due to lack of insurance. He was started on complera for 3 months but subsequently lost insurance and unable to afford it. He has now signed up for adap.   Feels like he is coming down a cold with sore throat yesterday, with body aches and chills x 2 days. No sick contact.  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   Review of Systems Other than uri symptoms, 10 point ROS is negative    Objective:   Physical Exam Temp(Src) 98.3 F (36.8 C) (Oral)  Wt 195 lb (88.451 kg)  BMI 27.98 kg/m2 Physical Exam  Constitutional: He is oriented to person, place, and time. He appears well-developed and well-nourished. No distress.  HENT:  Mouth/Throat: Oropharynx is clear and moist. No oropharyngeal exudate.  Cardiovascular: Normal rate, regular rhythm and normal heart sounds. Exam reveals no gallop and no friction rub.  No murmur heard.  Pulmonary/Chest: Effort normal and breath sounds normal. No respiratory distress. He has no wheezes.  Abdominal: Soft. Bowel sounds are normal. He exhibits no distension. There is no tenderness.  Lymphadenopathy:  He has no cervical adenopathy.  Neurological: He is alert and oriented to person, place, and time.  Skin: Skin is warm and dry. No rash noted. No erythema.  Psychiatric: He has a normal mood and affect. His behavior is normal.      Assessment & Plan:  HIV = will restart complera once adap is in place  Sharon symptoms = most likely viral, continue with supportive care and recommend over the counter meds  rtc in 4-6 wks

## 2012-09-22 ENCOUNTER — Other Ambulatory Visit: Payer: Self-pay | Admitting: *Deleted

## 2012-09-22 DIAGNOSIS — B20 Human immunodeficiency virus [HIV] disease: Secondary | ICD-10-CM

## 2012-09-22 MED ORDER — EMTRICITAB-RILPIVIR-TENOFOV DF 200-25-300 MG PO TABS: 1.0000 | ORAL_TABLET | Freq: Every day | ORAL | Status: DC

## 2012-10-30 IMAGING — CT CT PELVIS W/ CM
1 of 2 series · 15 of 32 positions shown, 19 images · IV contrast (omnipaque)
Comparison: None.

CLINICAL DATA: Right groin pain, radiating to the scrotum.  Fever
and leukocytosis.  Assess for abscess.

CT PELVIS WITH CONTRAST
TECHNIQUE: Multidetector CT imaging of the pelvis was performed
using the standard protocol following the bolus administration of
intravenous contrast.
Contrast:  100 mL of Omnipaque 300 IV contrast

[Series 2: pelvis with · axial · 0.60mm/px · z∈[-375,-140]mm · 15 of 53 slices shown, 19 images]
[im 3/53  soft-tissue]
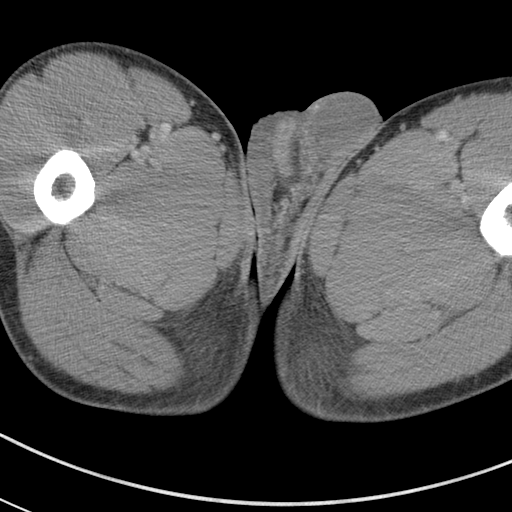
[im 3/53  bone]
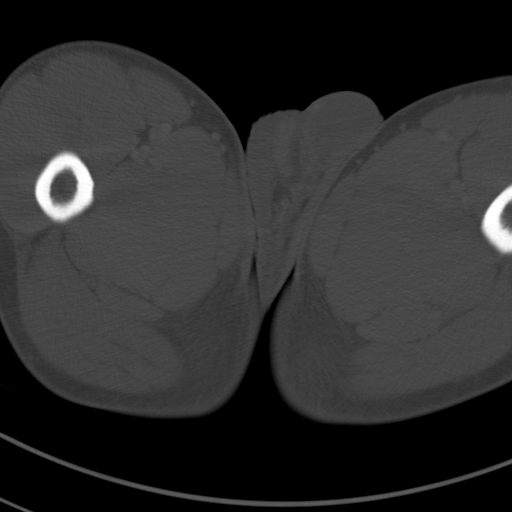
[im 7/53  soft-tissue]
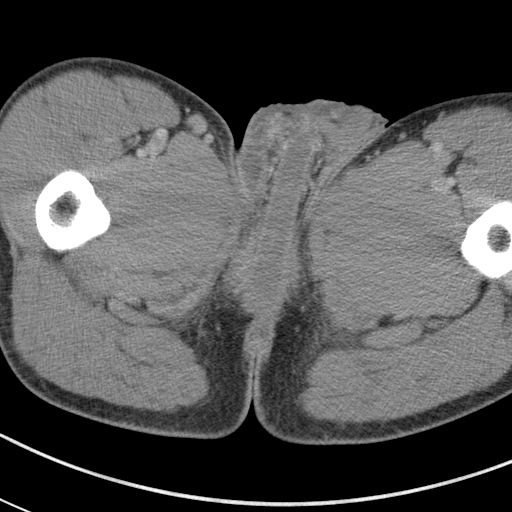
[im 11/53  soft-tissue]
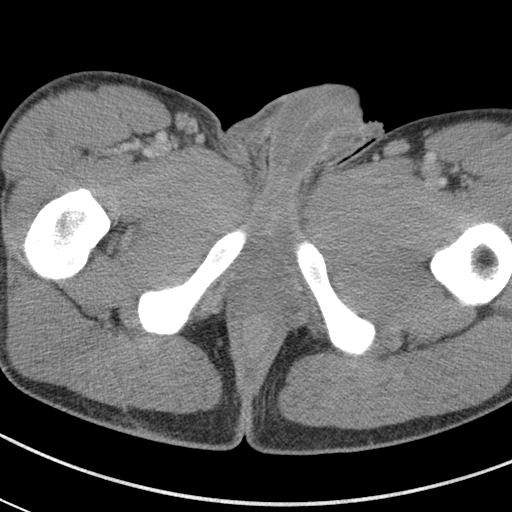
[im 16/53  soft-tissue]
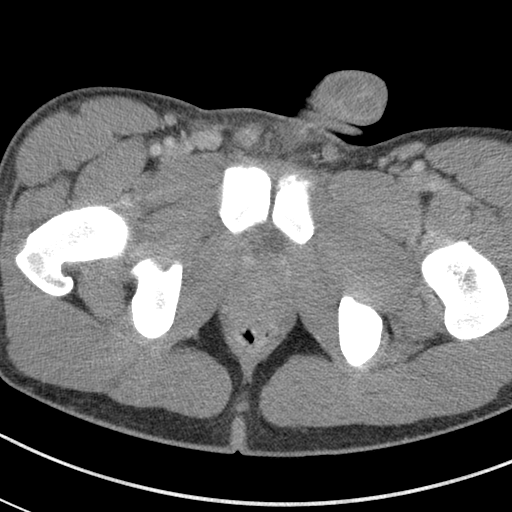
[im 18/53  soft-tissue]
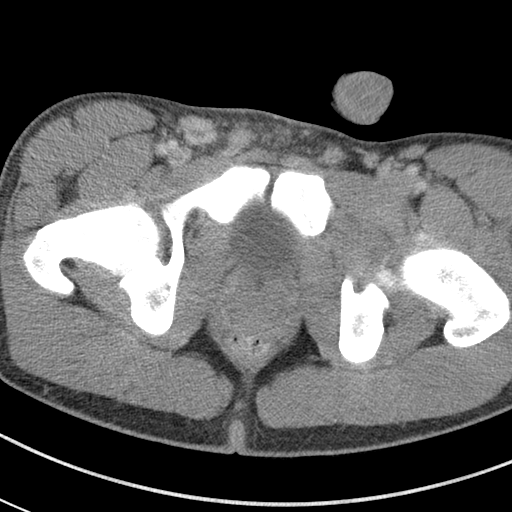
[im 22/53  soft-tissue]
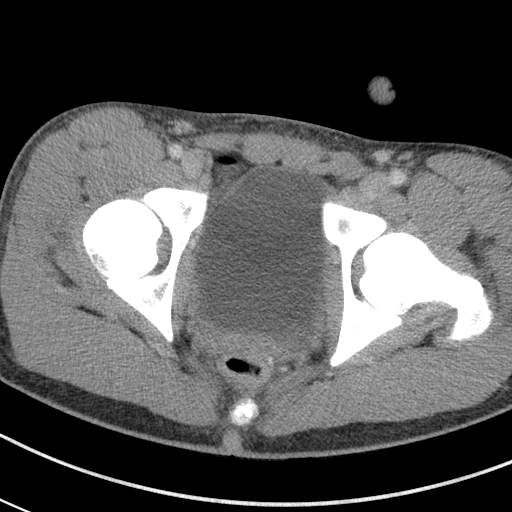
[im 27/53  soft-tissue]
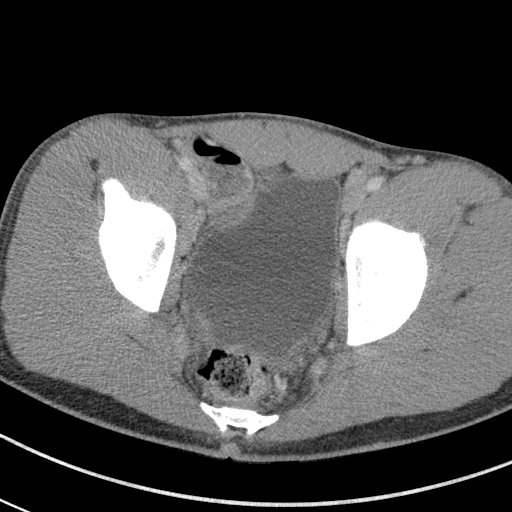
[im 31/53  soft-tissue]
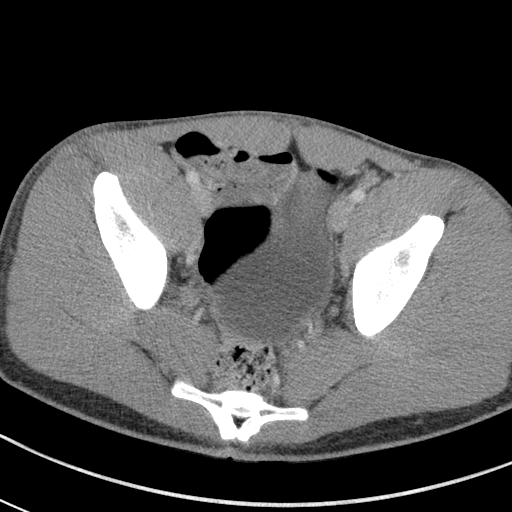
[im 35/53  soft-tissue]
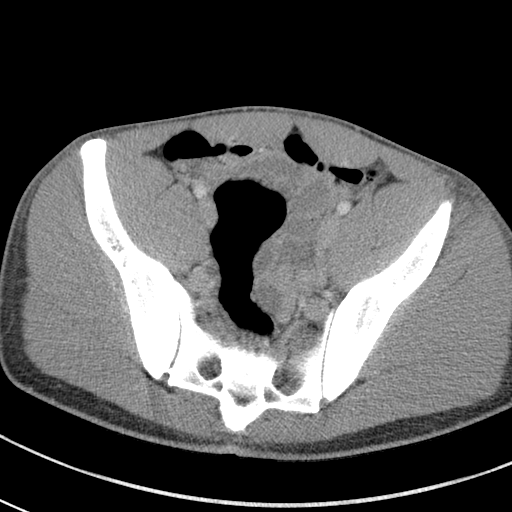
[im 35/53  bone]
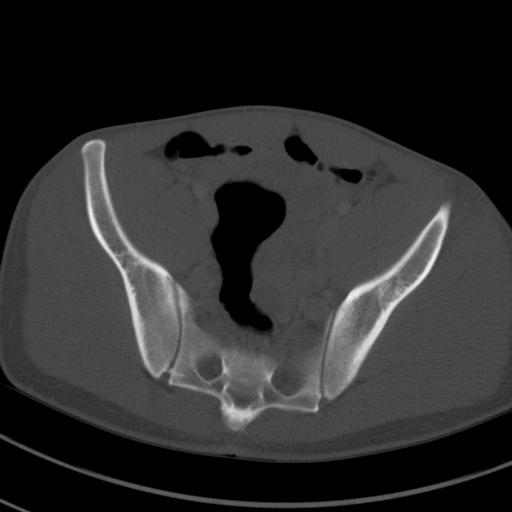
[im 37/53  soft-tissue]
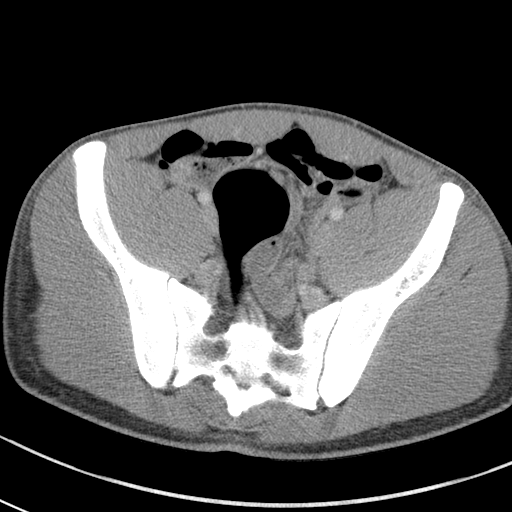
[im 42/53  soft-tissue]
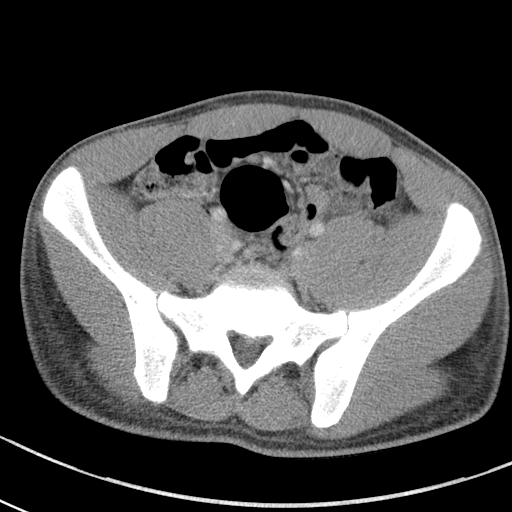
[im 44/53  lung]
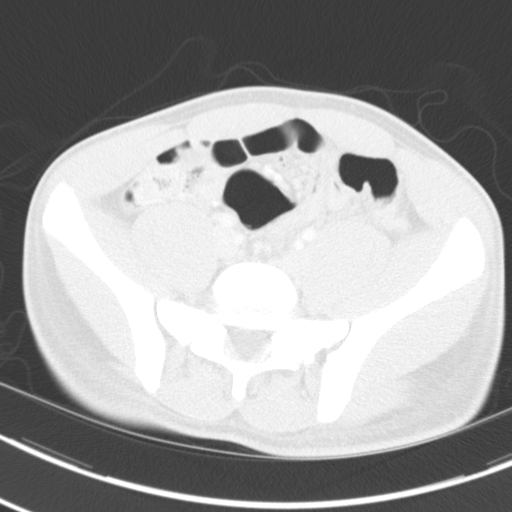
[im 46/53  soft-tissue]
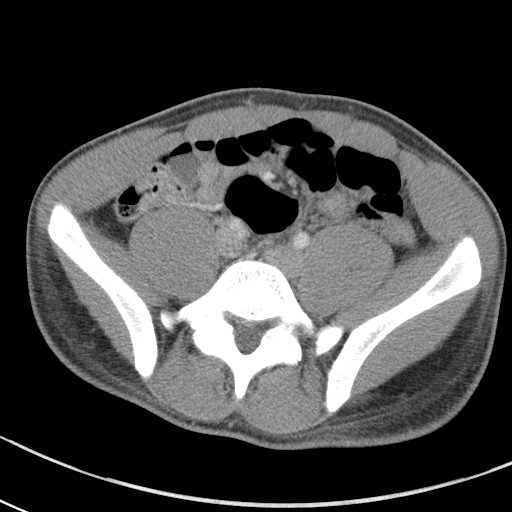
[im 46/53  lung]
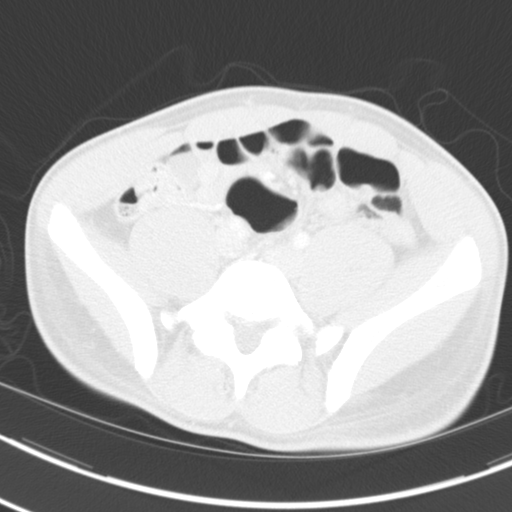
[im 48/53  lung]
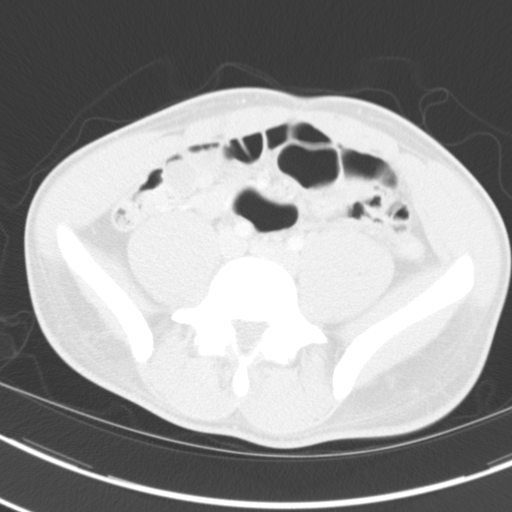
[im 50/53  soft-tissue]
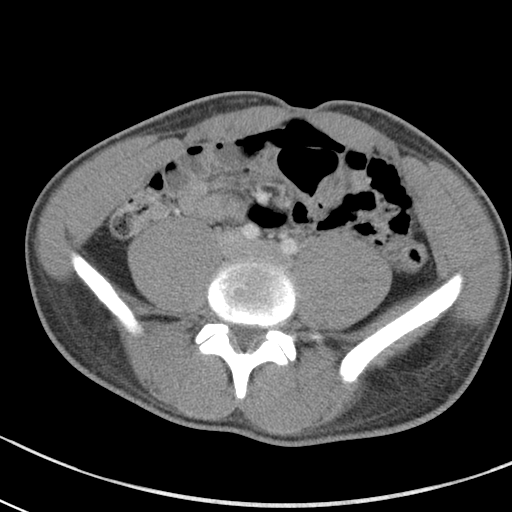
[im 50/53  lung]
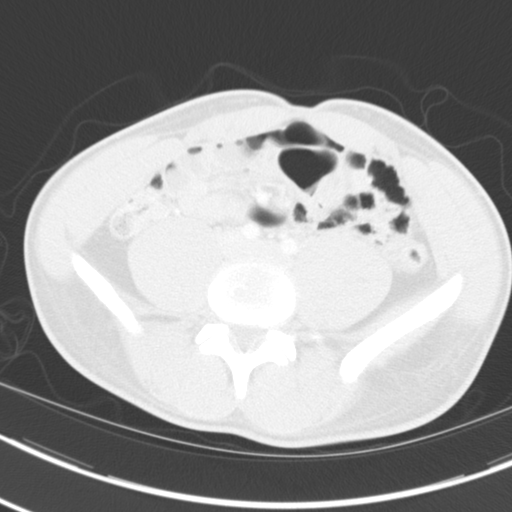

[15 of 32 positions shown; findings below may reference images not displayed]

FINDINGS: There is a small focal peripherally enhancing abscess
identified at the superior lateral right side of the scrotum,
measuring approximately 3.0 x 2.6 x 1.4 cm, just deep to the skin
surface.  There is no evidence of extension of the abscess.

Diffuse soft tissue edema is noted about the right scrotal sac.  No
significant hydrocele is seen.  The testes appear relatively
symmetric and are grossly unremarkable.

Mildly enlarged right inguinal nodes are noted, measuring up to
cm in short axis.  Associated soft tissue stranding is noted within
the right inguinal region, reflecting mild inflammation.

The soft tissues of the pelvis are grossly unremarkable.  The
visualized portions of the small large bowel are grossly
unremarkable.  The bladder is moderately distended and appears
grossly unremarkable; prominent vasculature about the bladder
likely reflects diffuse pelvic hyperemia.  The prostate remains
normal in size. No acute vascular abnormalities are otherwise
characterized.

No acute osseous abnormalities are seen.
IMPRESSION: 1.  Small focal abscess at the superior lateral right side of the
scrotum, measuring 3.0 x 2.6 x 1.4 cm, just deep to the skin
surface.  No evidence of extension of the abscess.
2.  Diffuse soft tissue edema noted about the right side of the
scrotal sac.
3.  Mild right inguinal lymphadenopathy noted.  Soft tissue
stranding extends to the right inguinal region, reflecting mild
inflammation.
4.  Diffuse pelvic hyperemia noted about the bladder.

## 2012-12-30 ENCOUNTER — Other Ambulatory Visit: Payer: Self-pay | Admitting: Internal Medicine

## 2012-12-30 DIAGNOSIS — B20 Human immunodeficiency virus [HIV] disease: Secondary | ICD-10-CM

## 2013-01-10 ENCOUNTER — Other Ambulatory Visit: Payer: Self-pay

## 2013-01-10 ENCOUNTER — Ambulatory Visit: Payer: Self-pay

## 2013-01-14 ENCOUNTER — Other Ambulatory Visit: Payer: Self-pay

## 2013-01-25 ENCOUNTER — Ambulatory Visit: Payer: Self-pay | Admitting: Internal Medicine

## 2013-01-25 ENCOUNTER — Other Ambulatory Visit: Payer: Self-pay

## 2013-01-26 ENCOUNTER — Other Ambulatory Visit (INDEPENDENT_AMBULATORY_CARE_PROVIDER_SITE_OTHER): Payer: Self-pay

## 2013-01-26 DIAGNOSIS — B20 Human immunodeficiency virus [HIV] disease: Secondary | ICD-10-CM

## 2013-01-26 LAB — COMPREHENSIVE METABOLIC PANEL
ALT: 13 U/L (ref 0–53)
Albumin: 4.3 g/dL (ref 3.5–5.2)
Alkaline Phosphatase: 79 U/L (ref 39–117)
CO2: 26 mEq/L (ref 19–32)
Glucose, Bld: 79 mg/dL (ref 70–99)
Potassium: 4.2 mEq/L (ref 3.5–5.3)
Sodium: 138 mEq/L (ref 135–145)
Total Protein: 8 g/dL (ref 6.0–8.3)

## 2013-01-27 LAB — CBC WITH DIFFERENTIAL/PLATELET
Hemoglobin: 14.8 g/dL (ref 13.0–17.0)
Lymphs Abs: 2.3 10*3/uL (ref 0.7–4.0)
Monocytes Relative: 13 % — ABNORMAL HIGH (ref 3–12)
Neutro Abs: 2.5 10*3/uL (ref 1.7–7.7)
Neutrophils Relative %: 45 % (ref 43–77)
Platelets: 266 10*3/uL (ref 150–400)
RBC: 6.36 MIL/uL — ABNORMAL HIGH (ref 4.22–5.81)
WBC: 5.6 10*3/uL (ref 4.0–10.5)

## 2013-01-27 LAB — HIV-1 RNA QUANT-NO REFLEX-BLD
HIV 1 RNA Quant: <20 copies/mL (ref <20)
HIV-1 RNA Quant, Log: <1.3 {Log} (ref <1.30)

## 2013-02-21 ENCOUNTER — Ambulatory Visit: Payer: Self-pay | Admitting: Internal Medicine

## 2013-02-25 ENCOUNTER — Encounter: Payer: Self-pay | Admitting: *Deleted

## 2013-02-25 NOTE — Progress Notes (Signed)
Patient ID: Ryan Martinez, male   DOB: Sep 04, 1990, 22 y.o.   MRN: [id number] Updated RW information.

## 2013-03-01 ENCOUNTER — Ambulatory Visit (INDEPENDENT_AMBULATORY_CARE_PROVIDER_SITE_OTHER): Payer: Self-pay | Admitting: Internal Medicine

## 2013-03-01 ENCOUNTER — Encounter: Payer: Self-pay | Admitting: Internal Medicine

## 2013-03-01 VITALS — BP 131/77 | HR 61 | Temp 98.3°F | Wt 214.0 lb

## 2013-03-01 DIAGNOSIS — B20 Human immunodeficiency virus [HIV] disease: Secondary | ICD-10-CM

## 2013-03-01 MED ORDER — EMTRICITAB-RILPIVIR-TENOFOV DF 200-25-300 MG PO TABS: 1.0000 | ORAL_TABLET | Freq: Every day | ORAL | Status: DC

## 2013-03-01 NOTE — Progress Notes (Signed)
[facility] NOTE  RFV: routine Subjective:    Patient ID: Ryan Martinez, male    DOB: 1990-10-26, 22 y.o.   MRN: [id number]  HPI  [name], CD 4 count 800/VL<20 on complera doing well. Not missing dose. He reports had a bout of shingles last year.  In cosmetology school, part-time.  Starting in a new relationship x 1 month. Older [name] HIV-.  Current Outpatient Prescriptions on File Prior to Visit  Medication Sig Dispense Refill  . acyclovir (ZOVIRAX) 400 MG tablet Take 1 tablet (400 mg total) by mouth 4 (four) times daily.  50 tablet  0  . Alum & Mag Hydroxide-Simeth (MAGIC MOUTHWASH W/LIDOCAINE) SOLN Take 10 mLs by mouth 3 (three) times daily as needed.  350 mL  0  . Emtricitab-Rilpivir-Tenofovir 200-25-300 MG TABS Take 1 tablet by mouth daily.  30 tablet  5  . oxyCODONE-acetaminophen (PERCOCET/ROXICET) 5-325 MG per tablet Take 1-2 tablets by mouth every 6 (six) hours as needed for pain.  20 tablet  0  . traMADol (ULTRAM) 50 MG tablet Take 1 tablet (50 mg total) by mouth every 6 (six) hours as needed for pain.  15 tablet  0   No current facility-administered medications on file prior to visit.   Active Ambulatory Problems    Diagnosis Date Noted  . Human immunodeficiency virus (HIV) disease 05/16/2011  . History of MRSA infection 07/07/2011   Resolved Ambulatory Problems    Diagnosis Date Noted  . No Resolved Ambulatory Problems   Past Medical History  Diagnosis Date  . HIV (human immunodeficiency virus infection)    History  Substance Use Topics  . Smoking status: Current Every Day Smoker -- 0.50 packs/day for 1 years    Types: Cigarettes  . Smokeless tobacco: Never Used  . Alcohol Use: 0.0 oz/week    1-2 Shots of liquor per week  family history includes Sarcoidosis in his mother.  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   Review of Systems  Constitutional: Negative for fever, chills, diaphoresis, activity change, appetite change, fatigue  and unexpected weight change.  HENT: Negative for congestion, sore throat, rhinorrhea, sneezing, trouble swallowing and sinus pressure.  Eyes: Negative for photophobia and visual disturbance.  Respiratory: Negative for cough, chest tightness, shortness of breath, wheezing and stridor.  Cardiovascular: Negative for chest pain, palpitations and leg swelling.  Gastrointestinal: Negative for nausea, vomiting, abdominal pain, diarrhea, constipation, blood in stool, abdominal distention and anal bleeding.  Genitourinary: Negative for dysuria, hematuria, flank pain and difficulty urinating.  Musculoskeletal: Negative for myalgias, back pain, joint swelling, arthralgias and gait problem.  Skin: Negative for color change, pallor, rash and wound.  Neurological: Negative for dizziness, tremors, weakness and light-headedness.  Hematological: Negative for adenopathy. Does not bruise/bleed easily.  Psychiatric/Behavioral: Negative for behavioral problems, confusion, sleep disturbance, dysphoric mood, decreased concentration and agitation.      Objective:   Physical Exam BP 131/77  Pulse 61  Temp(Src) 98.3 F (36.8 C) (Oral)  Wt 214 lb (97.07 kg)  BMI 30.71 kg/m2 Physical Exam  Constitutional: He is oriented to person, place, and time. He appears well-developed and well-nourished. No distress.  HENT:  Mouth/Throat: Oropharynx is clear and moist. No oropharyngeal exudate.  Cardiovascular: Normal rate, regular rhythm and normal heart sounds. Exam reveals no gallop and no friction rub.  No murmur heard.  Pulmonary/Chest: Effort normal and breath sounds normal. No respiratory distress. He has no wheezes.  Abdominal: Soft. Bowel sounds  are normal. He exhibits no distension. There is no tenderness.  Lymphadenopathy:  He has no cervical adenopathy.  Neurological: He is alert and oriented to person, place, and time.  Skin: Skin is warm and dry. No rash noted. No erythema.  Psychiatric: He has a normal  mood and affect. His behavior is normal.          Assessment & Plan:  hiv = will give refill for complera  Health maintenance = declined flu shot. Never had the flu shot before.  rtc  3months

## 2013-03-07 ENCOUNTER — Ambulatory Visit: Payer: Self-pay

## 2013-05-31 ENCOUNTER — Encounter: Payer: Self-pay | Admitting: Internal Medicine

## 2013-05-31 ENCOUNTER — Ambulatory Visit (INDEPENDENT_AMBULATORY_CARE_PROVIDER_SITE_OTHER): Payer: Self-pay | Admitting: Internal Medicine

## 2013-05-31 VITALS — BP 141/79 | HR 93 | Temp 98.6°F | Ht 70.0 in | Wt 220.0 lb

## 2013-05-31 DIAGNOSIS — Z22322 Carrier or suspected carrier of Methicillin resistant Staphylococcus aureus: Secondary | ICD-10-CM

## 2013-05-31 DIAGNOSIS — Z23 Encounter for immunization: Secondary | ICD-10-CM

## 2013-05-31 DIAGNOSIS — B20 Human immunodeficiency virus [HIV] disease: Secondary | ICD-10-CM

## 2013-05-31 LAB — COMPREHENSIVE METABOLIC PANEL
Albumin: 4.4 g/dL (ref 3.5–5.2)
Alkaline Phosphatase: 110 U/L (ref 39–117)
BUN: 12 mg/dL (ref 6–23)
CO2: 30 mEq/L (ref 19–32)
Calcium: 9.4 mg/dL (ref 8.4–10.5)
Chloride: 101 mEq/L (ref 96–112)
Glucose, Bld: 66 mg/dL — ABNORMAL LOW (ref 70–99)
Potassium: 4 mEq/L (ref 3.5–5.3)
Sodium: 138 mEq/L (ref 135–145)
Total Protein: 8.2 g/dL (ref 6.0–8.3)

## 2013-05-31 LAB — CBC
HCT: 44 % (ref 39.0–52.0)
Hemoglobin: 14.4 g/dL (ref 13.0–17.0)
MCHC: 32.7 g/dL (ref 30.0–36.0)
RBC: 6.23 MIL/uL — ABNORMAL HIGH (ref 4.22–5.81)

## 2013-05-31 MED ORDER — MUPIROCIN 2 % EX OINT: 1.0000 "application " | TOPICAL_OINTMENT | Freq: Three times a day (TID) | CUTANEOUS | Status: DC

## 2013-05-31 MED ORDER — CHLORHEXIDINE GLUCONATE 4 % EX LIQD: Freq: Every day | CUTANEOUS | Status: DC

## 2013-05-31 NOTE — Progress Notes (Signed)
   Subjective:    Patient ID: Ryan Martinez, male    DOB: 07-18-1990, 22 y.o.   MRN: [id number]  HPI [name] with HIV, CD 4 count of 800/VL<20 (aug) on complera. Doing well. Not missing doses. He reports having recurrent skin abscesses but has no problem currently  Current Outpatient Prescriptions on File Prior to Visit  Medication Sig Dispense Refill  . Emtricitab-Rilpivir-Tenofovir 200-25-300 MG TABS Take 1 tablet by mouth daily.  30 tablet  5   No current facility-administered medications on file prior to visit.   Active Ambulatory Problems    Diagnosis Date Noted  . Human immunodeficiency virus (HIV) disease 05/16/2011  . History of MRSA infection 07/07/2011   Resolved Ambulatory Problems    Diagnosis Date Noted  . No Resolved Ambulatory Problems   Past Medical History  Diagnosis Date  . HIV (human immunodeficiency virus infection)      Review of Systems  Constitutional: Negative for fever, chills, diaphoresis, activity change, appetite change, fatigue and unexpected weight change.  HENT: Negative for congestion, sore throat, rhinorrhea, sneezing, trouble swallowing and sinus pressure.  Eyes: Negative for photophobia and visual disturbance.  Respiratory: Negative for cough, chest tightness, shortness of breath, wheezing and stridor.  Cardiovascular: Negative for chest pain, palpitations and leg swelling.  Gastrointestinal: Negative for nausea, vomiting, abdominal pain, diarrhea, constipation, blood in stool, abdominal distention and anal bleeding.  Genitourinary: Negative for dysuria, hematuria, flank pain and difficulty urinating.  Musculoskeletal: Negative for myalgias, back pain, joint swelling, arthralgias and gait problem.  Skin: recurrent skin abscess Neurological: Negative for dizziness, tremors, weakness and light-headedness.  Hematological: Negative for adenopathy. Does not bruise/bleed easily.  Psychiatric/Behavioral: Negative for behavioral problems,  confusion, sleep disturbance, dysphoric mood, decreased concentration and agitation.       Objective:   Physical Exam BP 141/79  Pulse 93  Temp(Src) 98.6 F (37 C) (Oral)  Ht 5\' 10"  (1.778 m)  Wt 220 lb (99.791 kg)  BMI 31.57 kg/m2 Physical Exam  Constitutional: He is oriented to person, place, and time. He appears well-developed and well-nourished. No distress.  HENT:  Mouth/Throat: Oropharynx is clear and moist. No oropharyngeal exudate.  Cardiovascular: Normal rate, regular rhythm and normal heart sounds. Exam reveals no gallop and no friction rub.  No murmur heard.  Pulmonary/Chest: Effort normal and breath sounds normal. No respiratory distress. He has no wheezes.  Abdominal: Soft. Bowel sounds are normal. He exhibits no distension. There is no tenderness.  Lymphadenopathy:  He has no cervical adenopathy.  Neurological: He is alert and oriented to person, place, and time.  Skin: Skin is warm and dry. No rash noted. No erythema.  Psychiatric: He has a normal mood and affect. His behavior is normal.         Assessment & Plan:  hiv = well controlled. Will check labs today.   mrsa skin infection = will do a trial of decolonization with hibiclens and mupirocin  Health maintenance = will give 3rd dose of hep b and  flu vax  rtc in 3months

## 2013-06-01 LAB — T-HELPER CELL (CD4) - (RCID CLINIC ONLY)
CD4 % Helper T Cell: 34 % (ref 33–55)
CD4 T Cell Abs: 700 /uL (ref 400–2700)

## 2013-06-03 LAB — HIV-1 RNA QUANT-NO REFLEX-BLD: HIV-1 RNA Quant, Log: <1.3 {Log} (ref <1.30)

## 2013-08-30 ENCOUNTER — Ambulatory Visit: Payer: Self-pay

## 2013-08-30 ENCOUNTER — Encounter: Payer: Self-pay | Admitting: Internal Medicine

## 2013-08-30 ENCOUNTER — Ambulatory Visit (INDEPENDENT_AMBULATORY_CARE_PROVIDER_SITE_OTHER): Payer: Self-pay | Admitting: Internal Medicine

## 2013-08-30 VITALS — BP 135/85 | HR 82 | Temp 97.2°F | Wt 215.0 lb

## 2013-08-30 DIAGNOSIS — Z23 Encounter for immunization: Secondary | ICD-10-CM

## 2013-08-30 DIAGNOSIS — Z113 Encounter for screening for infections with a predominantly sexual mode of transmission: Secondary | ICD-10-CM

## 2013-08-30 DIAGNOSIS — B20 Human immunodeficiency virus [HIV] disease: Secondary | ICD-10-CM

## 2013-08-30 NOTE — Progress Notes (Signed)
   Subjective:    Patient ID: Ryan Martinez, male    DOB: Mar 30, 1991, 23 y.o.   MRN: [id number]  HPI 700/VL<20, on complera, does well with adherence but occ takes it without food. Has some nasal congestion today which he attributes to change in weather. Otherwise in good health.  Current Outpatient Prescriptions on File Prior to Visit  Medication Sig Dispense Refill  . chlorhexidine (HIBICLENS) 4 % external liquid Apply topically daily. X 14 days  120 mL  0  . Emtricitab-Rilpivir-Tenofovir 200-25-300 MG TABS Take 1 tablet by mouth daily.  30 tablet  5  . mupirocin ointment (BACTROBAN) 2 % Place 1 application into the nose 3 (three) times daily. X 14 days. Can do generic  22 g  0   No current facility-administered medications on file prior to visit.   Active Ambulatory Problems    Diagnosis Date Noted  . Human immunodeficiency virus (HIV) disease 05/16/2011  . History of MRSA infection 07/07/2011   Resolved Ambulatory Problems    Diagnosis Date Noted  . No Resolved Ambulatory Problems   Past Medical History  Diagnosis Date  . HIV (human immunodeficiency virus infection)      Review of Systems  Constitutional: Negative for fever, chills, diaphoresis, activity change, appetite change, fatigue and unexpected weight change.  HENT: Negative for congestion, sore throat, rhinorrhea, sneezing, trouble swallowing and sinus pressure.  Eyes: Negative for photophobia and visual disturbance.  Respiratory: Negative for cough, chest tightness, shortness of breath, wheezing and stridor.  Cardiovascular: Negative for chest pain, palpitations and leg swelling.  Gastrointestinal: Negative for nausea, vomiting, abdominal pain, diarrhea, constipation, blood in stool, abdominal distention and anal bleeding.  Genitourinary: Negative for dysuria, hematuria, flank pain and difficulty urinating.  Musculoskeletal: Negative for myalgias, back pain, joint swelling, arthralgias and gait problem.    Skin: Negative for color change, pallor, rash and wound.  Neurological: Negative for dizziness, tremors, weakness and light-headedness.  Hematological: Negative for adenopathy. Does not bruise/bleed easily.  Psychiatric/Behavioral: Negative for behavioral problems, confusion, sleep disturbance, dysphoric mood, decreased concentration and agitation.       Objective:   Physical Exam  BP 135/85  Pulse 82  Temp(Src) 97.2 F (36.2 C) (Oral)  Wt 215 lb (97.523 kg)  Physical Exam  Constitutional: He is oriented to person, place, and time. He appears well-developed and well-nourished. No distress. .  Neurological: He is alert and oriented to person, place, and time.  Skin: Skin is warm and dry. No rash noted. No erythema.  Psychiatric: He has a normal mood and affect. His behavior is normal.       Assessment & Plan:  hiv =well controlled. Will check labs today. Plus rpr. Need adap reapproval  Health maintenance = will give hep a #1  rtc in 6 months

## 2013-08-31 ENCOUNTER — Telehealth: Payer: Self-pay | Admitting: *Deleted

## 2013-08-31 LAB — T.PALLIDUM AB, TOTAL: T PALLIDUM ANTIBODIES (TP-PA): 7.24 {s_co_ratio} — AB (ref <0.90)

## 2013-08-31 LAB — RPR TITER

## 2013-08-31 LAB — RPR: RPR: REACTIVE — AB

## 2013-08-31 NOTE — Telephone Encounter (Signed)
Relayed test results.  Scheduled patient for the next 3 Thursdays at 2:00 for injections.  Pt verbalized understanding. Andree CossHowell, Eugean Arnott M, RN

## 2013-08-31 NOTE — Telephone Encounter (Signed)
Message copied by Andree CossHOWELL, Liela Rylee M on Wed Aug 31, 2013  5:07 PM ------      Message from: Judyann MunsonSNIDER, CYNTHIA      Created: Wed Aug 31, 2013  3:47 PM       i might have emailed u about this already. Can u arrange to have him come in for 3 wkly bicillin inj. thx ------

## 2013-09-01 ENCOUNTER — Ambulatory Visit (INDEPENDENT_AMBULATORY_CARE_PROVIDER_SITE_OTHER): Payer: Self-pay | Admitting: *Deleted

## 2013-09-01 DIAGNOSIS — A539 Syphilis, unspecified: Secondary | ICD-10-CM

## 2013-09-01 LAB — T-HELPER CELL (CD4) - (RCID CLINIC ONLY)
CD4 % Helper T Cell: 29 % — ABNORMAL LOW (ref 33–55)
CD4 T CELL ABS: 520 /uL (ref 400–2700)

## 2013-09-01 MED ORDER — PENICILLIN G BENZATHINE 1200000 UNIT/2ML IM SUSP
1.2000 10*6.[IU] | Freq: Once | INTRAMUSCULAR | Status: AC
  Administered 2013-09-01: 1.2 10*6.[IU] via INTRAMUSCULAR

## 2013-09-02 LAB — HIV-1 RNA QUANT-NO REFLEX-BLD: HIV 1 RNA Quant: <20 copies/mL (ref <20)

## 2013-09-08 ENCOUNTER — Ambulatory Visit (INDEPENDENT_AMBULATORY_CARE_PROVIDER_SITE_OTHER): Payer: Self-pay | Admitting: Licensed Clinical Social Worker

## 2013-09-08 ENCOUNTER — Ambulatory Visit: Payer: Self-pay

## 2013-09-08 ENCOUNTER — Encounter: Payer: Self-pay | Admitting: *Deleted

## 2013-09-08 DIAGNOSIS — A539 Syphilis, unspecified: Secondary | ICD-10-CM

## 2013-09-08 MED ORDER — PENICILLIN G BENZATHINE 1200000 UNIT/2ML IM SUSP
1.2000 10*6.[IU] | Freq: Once | INTRAMUSCULAR | Status: AC
  Administered 2013-09-08: 1.2 10*6.[IU] via INTRAMUSCULAR

## 2013-09-08 NOTE — Progress Notes (Signed)
Patient tolerated injections well, will return next week for last injection

## 2013-09-13 ENCOUNTER — Other Ambulatory Visit: Payer: Self-pay | Admitting: *Deleted

## 2013-09-13 DIAGNOSIS — B20 Human immunodeficiency virus [HIV] disease: Secondary | ICD-10-CM

## 2013-09-13 MED ORDER — EMTRICITAB-RILPIVIR-TENOFOV DF 200-25-300 MG PO TABS: 1.0000 | ORAL_TABLET | Freq: Every day | ORAL | Status: DC

## 2013-09-15 ENCOUNTER — Ambulatory Visit (INDEPENDENT_AMBULATORY_CARE_PROVIDER_SITE_OTHER): Payer: Self-pay | Admitting: *Deleted

## 2013-09-15 DIAGNOSIS — A539 Syphilis, unspecified: Secondary | ICD-10-CM

## 2013-09-15 MED ORDER — PENICILLIN G BENZATHINE 1200000 UNIT/2ML IM SUSP
1.2000 10*6.[IU] | Freq: Once | INTRAMUSCULAR | Status: AC
  Administered 2013-09-15: 1.2 10*6.[IU] via INTRAMUSCULAR

## 2013-09-26 ENCOUNTER — Telehealth: Payer: Self-pay | Admitting: *Deleted

## 2013-09-26 NOTE — Telephone Encounter (Signed)
Faxed application to  [facility] for third time.  Keeps denying for missing date of doctor's signature.

## 2013-12-23 ENCOUNTER — Other Ambulatory Visit: Payer: Self-pay | Admitting: Internal Medicine

## 2013-12-23 ENCOUNTER — Other Ambulatory Visit: Payer: Self-pay | Admitting: *Deleted

## 2013-12-23 DIAGNOSIS — B20 Human immunodeficiency virus [HIV] disease: Secondary | ICD-10-CM

## 2014-01-17 ENCOUNTER — Other Ambulatory Visit: Payer: Self-pay

## 2014-01-27 ENCOUNTER — Other Ambulatory Visit (INDEPENDENT_AMBULATORY_CARE_PROVIDER_SITE_OTHER): Payer: Self-pay

## 2014-01-27 DIAGNOSIS — B20 Human immunodeficiency virus [HIV] disease: Secondary | ICD-10-CM

## 2014-01-27 DIAGNOSIS — Z79899 Other long term (current) drug therapy: Secondary | ICD-10-CM

## 2014-01-27 DIAGNOSIS — Z113 Encounter for screening for infections with a predominantly sexual mode of transmission: Secondary | ICD-10-CM

## 2014-01-27 LAB — COMPREHENSIVE METABOLIC PANEL
ALT: 16 U/L (ref 0–53)
AST: 17 U/L (ref 0–37)
Albumin: 4.3 g/dL (ref 3.5–5.2)
Alkaline Phosphatase: 69 U/L (ref 39–117)
BUN: 15 mg/dL (ref 6–23)
CO2: 28 mEq/L (ref 19–32)
Calcium: 9 mg/dL (ref 8.4–10.5)
Chloride: 103 mEq/L (ref 96–112)
Creat: 1.09 mg/dL (ref 0.50–1.35)
Glucose, Bld: 122 mg/dL — ABNORMAL HIGH (ref 70–99)
POTASSIUM: 4.3 meq/L (ref 3.5–5.3)
Sodium: 139 mEq/L (ref 135–145)
TOTAL PROTEIN: 7.3 g/dL (ref 6.0–8.3)
Total Bilirubin: 0.7 mg/dL (ref 0.2–1.2)

## 2014-01-27 LAB — CBC WITH DIFFERENTIAL/PLATELET
BASOS ABS: 0.1 10*3/uL (ref 0.0–0.1)
Basophils Relative: 1 % (ref 0–1)
Eosinophils Absolute: 0.2 10*3/uL (ref 0.0–0.7)
Eosinophils Relative: 3 % (ref 0–5)
HEMATOCRIT: 45.3 % (ref 39.0–52.0)
Hemoglobin: 15.2 g/dL (ref 13.0–17.0)
LYMPHS ABS: 2.9 10*3/uL (ref 0.7–4.0)
Lymphocytes Relative: 46 % (ref 12–46)
MCH: 23.7 pg — ABNORMAL LOW (ref 26.0–34.0)
MCHC: 33.6 g/dL (ref 30.0–36.0)
MCV: 70.6 fL — ABNORMAL LOW (ref 78.0–100.0)
Monocytes Absolute: 0.6 10*3/uL (ref 0.1–1.0)
Monocytes Relative: 9 % (ref 3–12)
NEUTROS ABS: 2.6 10*3/uL (ref 1.7–7.7)
Neutrophils Relative %: 41 % — ABNORMAL LOW (ref 43–77)
PLATELETS: 254 10*3/uL (ref 150–400)
RBC: 6.42 MIL/uL — ABNORMAL HIGH (ref 4.22–5.81)
RDW: 14 % (ref 11.5–15.5)
WBC: 6.3 10*3/uL (ref 4.0–10.5)

## 2014-01-27 LAB — LIPID PANEL
Cholesterol: 144 mg/dL (ref 0–200)
HDL: 36 mg/dL — ABNORMAL LOW (ref >39)
LDL CALC: 78 mg/dL (ref 0–99)
Total CHOL/HDL Ratio: 4 Ratio
Triglycerides: 151 mg/dL — ABNORMAL HIGH (ref <150)
VLDL: 30 mg/dL (ref 0–40)

## 2014-01-27 LAB — T-HELPER CELL (CD4) - (RCID CLINIC ONLY)
CD4 T CELL HELPER: 39 % (ref 33–55)
CD4 T Cell Abs: 1170 /uL (ref 400–2700)

## 2014-01-31 ENCOUNTER — Ambulatory Visit: Payer: Self-pay | Admitting: Internal Medicine

## 2014-01-31 LAB — HIV-1 RNA QUANT-NO REFLEX-BLD
HIV 1 RNA Quant: <20 copies/mL (ref <20)
HIV-1 RNA Quant, Log: <1.3 {Log} (ref <1.30)

## 2014-02-08 ENCOUNTER — Ambulatory Visit: Payer: Self-pay | Admitting: Internal Medicine

## 2014-02-22 ENCOUNTER — Ambulatory Visit (INDEPENDENT_AMBULATORY_CARE_PROVIDER_SITE_OTHER): Payer: Self-pay | Admitting: Internal Medicine

## 2014-02-22 ENCOUNTER — Encounter: Payer: Self-pay | Admitting: Internal Medicine

## 2014-02-22 VITALS — BP 144/79 | HR 84 | Temp 98.3°F | Ht 71.0 in | Wt 227.0 lb

## 2014-02-22 DIAGNOSIS — A530 Latent syphilis, unspecified as early or late: Secondary | ICD-10-CM

## 2014-02-22 DIAGNOSIS — B20 Human immunodeficiency virus [HIV] disease: Secondary | ICD-10-CM

## 2014-02-22 DIAGNOSIS — L0293 Carbuncle, unspecified: Secondary | ICD-10-CM

## 2014-02-22 DIAGNOSIS — L0292 Furuncle, unspecified: Secondary | ICD-10-CM

## 2014-02-22 DIAGNOSIS — Z23 Encounter for immunization: Secondary | ICD-10-CM

## 2014-02-22 MED ORDER — DOXYCYCLINE HYCLATE 100 MG PO TABS: 100.0000 mg | ORAL_TABLET | Freq: Two times a day (BID) | ORAL | Status: DC

## 2014-02-22 NOTE — Progress Notes (Signed)
Patient ID: ANN GROENEVELD, male   DOB: Feb 18, 1991, 23 y.o.   MRN: [id number]       Patient ID: ISIAIH HOLLENBACH, male   DOB: 1991-01-23, 23 y.o.   MRN: [id number]  HPI Elwood is a 23yo M with HIV disease, well controlled, CD 4 count of 1170/VL<20, on complera with great adherence. He has been in good state of health since we last saw him roughly 6 months ago. He has noticed recurrence of skin infection/furuncle develop over the last 4 days. Tender, warm to touch, no drainage, near his right inner thigh/groin area which is irritated by his underwear. No fever, chills.  Outpatient Encounter Prescriptions as of 02/22/2014  Medication Sig  . COMPLERA 200-25-300 MG TABS TAKE 1 TABLET BY MOUTH DAILY  . chlorhexidine (HIBICLENS) 4 % external liquid Apply topically daily. X 14 days  . mupirocin ointment (BACTROBAN) 2 % Place 1 application into the nose 3 (three) times daily. X 14 days. Can do generic     Patient Active Problem List   Diagnosis Date Noted  . History of MRSA infection 07/07/2011  . Human immunodeficiency virus (HIV) disease 05/16/2011     Health Maintenance Due  Topic Date Due  . Tetanus/tdap  10/18/2010  . Influenza Vaccine  01/07/2014     Review of Systems + skin infection, otherwise 10 point ros is negative Physical Exam   BP 144/79  Pulse 84  Temp(Src) 98.3 F (36.8 C) (Oral)  Ht  (1.803 m)  Wt 227 lb (102.967 kg)  BMI 31.67 kg/m2 Physical Exam  Constitutional: He is oriented to person, place, and time. He appears well-developed and well-nourished. No distress.  HENT:  Mouth/Throat: Oropharynx is clear and moist. No oropharyngeal exudate.  Cardiovascular: Normal rate, regular rhythm and normal heart sounds. Exam reveals no gallop and no friction rub.  No murmur heard.  Pulmonary/Chest: Effort normal and breath sounds normal. No respiratory distress. He has no wheezes.  Abdominal: Soft. Bowel sounds are normal. He exhibits no distension. There  is no tenderness.  Lymphadenopathy:  He has no cervical adenopathy.  Neurological: He is alert and oriented to person, place, and time.  Skin: Skin is warm to touch to right upper/inner thigh, indurated, no fluctuance, TTP, 3cm x 1 cm long Psychiatric: He has a normal mood and affect. His behavior is normal.    Lab Results  Component Value Date   CD4TCELL 39 01/27/2014   Lab Results  Component Value Date   CD4TABS 1170 01/27/2014   CD4TABS 520 08/30/2013   CD4TABS 700 05/31/2013   Lab Results  Component Value Date   HIV1RNAQUANT <20 01/27/2014   Lab Results  Component Value Date   HEPBSAB NEG 05/13/2011   No results found for this basename: RPR    CBC Lab Results  Component Value Date   WBC 6.3 01/27/2014   RBC 6.42* 01/27/2014   HGB 15.2 01/27/2014   HCT 45.3 01/27/2014   PLT 254 01/27/2014   MCV 70.6* 01/27/2014   MCH 23.7* 01/27/2014   MCHC 33.6 01/27/2014   RDW 14.0 01/27/2014   LYMPHSABS 2.9 01/27/2014   MONOABS 0.6 01/27/2014   EOSABS 0.2 01/27/2014   BASOSABS 0.1 01/27/2014   BMET Lab Results  Component Value Date   NA 139 01/27/2014   K 4.3 01/27/2014   CL 103 01/27/2014   CO2 28 01/27/2014   GLUCOSE 122* 01/27/2014   BUN 15 01/27/2014   CREATININE 1.09 01/27/2014   CALCIUM 9.0 01/27/2014  GFRNONAA >89 09/08/2012   GFRAA >89 09/08/2012     Assessment and Plan     hiv = well controlled, continue with complera  Health maintenance = will give flu shot  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 Recommend warm compresses

## 2014-02-23 LAB — RPR TITER

## 2014-02-23 LAB — RPR: RPR Ser Ql: REACTIVE — AB

## 2014-02-24 LAB — FLUORESCENT TREPONEMAL AB(FTA)-IGG-BLD: Fluorescent Treponemal ABS: REACTIVE — AB

## 2014-04-03 ENCOUNTER — Ambulatory Visit: Payer: Self-pay

## 2014-04-14 ENCOUNTER — Other Ambulatory Visit: Payer: Self-pay | Admitting: *Deleted

## 2014-04-14 DIAGNOSIS — B20 Human immunodeficiency virus [HIV] disease: Secondary | ICD-10-CM

## 2014-04-14 MED ORDER — EMTRICITAB-RILPIVIR-TENOFOV DF 200-25-300 MG PO TABS: ORAL_TABLET | ORAL | Status: DC

## 2014-05-23 ENCOUNTER — Ambulatory Visit: Payer: Self-pay | Admitting: Internal Medicine

## 2014-07-06 ENCOUNTER — Ambulatory Visit: Payer: Self-pay | Admitting: Internal Medicine

## 2014-07-10 ENCOUNTER — Other Ambulatory Visit: Payer: Self-pay | Admitting: *Deleted

## 2014-07-10 DIAGNOSIS — B20 Human immunodeficiency virus [HIV] disease: Secondary | ICD-10-CM

## 2014-07-10 MED ORDER — EMTRICITAB-RILPIVIR-TENOFOV DF 200-25-300 MG PO TABS: ORAL_TABLET | ORAL | Status: DC

## 2014-08-07 ENCOUNTER — Other Ambulatory Visit (INDEPENDENT_AMBULATORY_CARE_PROVIDER_SITE_OTHER): Payer: Self-pay

## 2014-08-07 DIAGNOSIS — Z113 Encounter for screening for infections with a predominantly sexual mode of transmission: Secondary | ICD-10-CM

## 2014-08-07 DIAGNOSIS — B20 Human immunodeficiency virus [HIV] disease: Secondary | ICD-10-CM

## 2014-08-07 DIAGNOSIS — Z79899 Other long term (current) drug therapy: Secondary | ICD-10-CM

## 2014-08-07 LAB — CBC WITH DIFFERENTIAL/PLATELET
Basophils Absolute: 0 10*3/uL (ref 0.0–0.1)
Basophils Relative: 0 % (ref 0–1)
EOS PCT: 2 % (ref 0–5)
Eosinophils Absolute: 0.1 10*3/uL (ref 0.0–0.7)
HCT: 46.5 % (ref 39.0–52.0)
Hemoglobin: 14.3 g/dL (ref 13.0–17.0)
Lymphocytes Relative: 32 % (ref 12–46)
Lymphs Abs: 1.8 10*3/uL (ref 0.7–4.0)
MCH: 22.8 pg — ABNORMAL LOW (ref 26.0–34.0)
MCHC: 30.8 g/dL (ref 30.0–36.0)
MCV: 74.2 fL — ABNORMAL LOW (ref 78.0–100.0)
MPV: 8.8 fL (ref 8.6–12.4)
Monocytes Absolute: 0.7 10*3/uL (ref 0.1–1.0)
Monocytes Relative: 12 % (ref 3–12)
Neutro Abs: 3 10*3/uL (ref 1.7–7.7)
Neutrophils Relative %: 54 % (ref 43–77)
PLATELETS: 239 10*3/uL (ref 150–400)
RBC: 6.27 MIL/uL — ABNORMAL HIGH (ref 4.22–5.81)
RDW: 14.8 % (ref 11.5–15.5)
WBC: 5.6 10*3/uL (ref 4.0–10.5)

## 2014-08-08 LAB — COMPREHENSIVE METABOLIC PANEL
ALT: 18 U/L (ref 0–53)
AST: 15 U/L (ref 0–37)
Albumin: 4.2 g/dL (ref 3.5–5.2)
Alkaline Phosphatase: 70 U/L (ref 39–117)
BUN: 13 mg/dL (ref 6–23)
CALCIUM: 9.1 mg/dL (ref 8.4–10.5)
CHLORIDE: 103 meq/L (ref 96–112)
CO2: 22 meq/L (ref 19–32)
CREATININE: 1.18 mg/dL (ref 0.50–1.35)
Glucose, Bld: 89 mg/dL (ref 70–99)
POTASSIUM: 4 meq/L (ref 3.5–5.3)
SODIUM: 140 meq/L (ref 135–145)
TOTAL PROTEIN: 7.4 g/dL (ref 6.0–8.3)
Total Bilirubin: 1.2 mg/dL (ref 0.2–1.2)

## 2014-08-08 LAB — LIPID PANEL
Cholesterol: 120 mg/dL (ref 0–200)
HDL: 35 mg/dL — ABNORMAL LOW
LDL Cholesterol: 62 mg/dL (ref 0–99)
Total CHOL/HDL Ratio: 3.4 ratio
Triglycerides: 114 mg/dL
VLDL: 23 mg/dL (ref 0–40)

## 2014-08-08 LAB — FLUORESCENT TREPONEMAL AB(FTA)-IGG-BLD: Fluorescent Treponemal ABS: REACTIVE — AB

## 2014-08-08 LAB — HIV-1 RNA QUANT-NO REFLEX-BLD
HIV 1 RNA Quant: 139 copies/mL — ABNORMAL HIGH (ref <20)
HIV-1 RNA QUANT, LOG: 2.14 {Log} — AB (ref <1.30)

## 2014-08-08 LAB — RPR: RPR: REACTIVE — AB

## 2014-08-08 LAB — T-HELPER CELL (CD4) - (RCID CLINIC ONLY)
CD4 % Helper T Cell: 34 % (ref 33–55)
CD4 T CELL ABS: 690 /uL (ref 400–2700)

## 2014-08-08 LAB — RPR TITER: RPR Titer: 1:16 {titer} — AB

## 2014-08-08 LAB — URINE CYTOLOGY ANCILLARY ONLY
Chlamydia: NEGATIVE
Neisseria Gonorrhea: NEGATIVE

## 2014-09-14 ENCOUNTER — Ambulatory Visit (INDEPENDENT_AMBULATORY_CARE_PROVIDER_SITE_OTHER): Payer: Self-pay | Admitting: Internal Medicine

## 2014-09-14 ENCOUNTER — Encounter: Payer: Self-pay | Admitting: Internal Medicine

## 2014-09-14 VITALS — BP 149/76 | HR 88 | Temp 98.8°F | Wt 227.0 lb

## 2014-09-14 DIAGNOSIS — Z8619 Personal history of other infectious and parasitic diseases: Secondary | ICD-10-CM

## 2014-09-14 DIAGNOSIS — B20 Human immunodeficiency virus [HIV] disease: Secondary | ICD-10-CM

## 2014-09-14 DIAGNOSIS — R030 Elevated blood-pressure reading, without diagnosis of hypertension: Secondary | ICD-10-CM

## 2014-09-14 DIAGNOSIS — Z21 Asymptomatic human immunodeficiency virus [HIV] infection status: Secondary | ICD-10-CM

## 2014-09-14 DIAGNOSIS — Z113 Encounter for screening for infections with a predominantly sexual mode of transmission: Secondary | ICD-10-CM

## 2014-09-14 NOTE — Progress Notes (Signed)
Subjective:    Patient ID: Ryan Martinez, male    DOB: 14-Mar-1991, 24 y.o.   MRN: [id number]  HPI [name] is a 24 y/o male with latent syphilis and HIV infection (dx in 2012). He is currently taking Complera everyday with meals and denies missing doses. He had been off of the complera for a couple of months due to loss of coverage with [employer]. He has been back on for a couple of months. His last VL was 139 and CD4 of 690. He had a two-fold increase in his RPR titer. He denies any sexual partners since September.  Current Outpatient Prescriptions on File Prior to Visit  Medication Sig Dispense Refill  . Emtricitab-Rilpivir-Tenofovir (COMPLERA) 200-25-300 MG TABS TAKE 1 TABLET BY MOUTH DAILY 30 tablet 5  . mupirocin ointment (BACTROBAN) 2 % Place 1 application into the nose 3 (three) times daily. X 14 days. Can do generic 22 g 0  . chlorhexidine (HIBICLENS) 4 % external liquid Apply topically daily. X 14 days (Patient not taking: Reported on 09/14/2014) 120 mL 0  . doxycycline (VIBRA-TABS) 100 MG tablet Take 1 tablet (100 mg total) by mouth 2 (two) times daily. (Patient not taking: Reported on 09/14/2014) 14 tablet 0   No current facility-administered medications on file prior to visit.   Active Ambulatory Problems    Diagnosis Date Noted  . Human immunodeficiency virus (HIV) disease 05/16/2011  . History of MRSA infection 07/07/2011   Resolved Ambulatory Problems    Diagnosis Date Noted  . No Resolved Ambulatory Problems   Past Medical History  Diagnosis Date  . HIV (human immunodeficiency virus infection)      Review of Systems  Constitutional: Positive for fatigue. Negative for fever, activity change, appetite change and unexpected weight change.  HENT: Negative.   Eyes: Negative.   Respiratory: Negative.   Cardiovascular: Negative.   Gastrointestinal: Negative.   Endocrine: Negative.   Genitourinary: Negative.   Musculoskeletal: Negative.   Allergic/Immunologic:  Negative.   Neurological: Negative.   Hematological: Negative.   Psychiatric/Behavioral: Negative.        Objective:   Physical Exam  Constitutional: He is oriented to person, place, and time. He appears well-developed and well-nourished. No distress.  HENT:  Head: Normocephalic and atraumatic.  Mouth/Throat: No oropharyngeal exudate.  Eyes: Pupils are equal, round, and reactive to light.  Neck: Normal range of motion. Neck supple.  Cardiovascular: Normal rate and regular rhythm.   Pulmonary/Chest: Effort normal and breath sounds normal.  Abdominal: Soft. Bowel sounds are normal.  Genitourinary: Rectum normal and penis normal.  Musculoskeletal: Normal range of motion.  Lymphadenopathy:    He has no cervical adenopathy.  Neurological: He is alert and oriented to person, place, and time.  Skin: Skin is warm and dry.  Psychiatric: He has a normal mood and affect. His behavior is normal.  Vitals reviewed.  BP 149/76 mmHg  Pulse 88  Temp(Src) 98.8 F (37.1 C) (Oral)  Wt 227 lb (102.967 kg)        Assessment & Plan:  HIV: well controlled, continue on current regimen Will recheck VL and CD4 count today Will RTC in 3 months with labs  RPR: has hx of syphilis, and prior treatment. Will recheck titer today to determine if he needs to be tx  Pre-hypertension = will ask him to do low salt diet, though difficult since he works at [employer] as a Production designer, theatre/television/filmmanager. Continue to monitor at next appt, may need meds in the future.

## 2014-09-15 DIAGNOSIS — R030 Elevated blood-pressure reading, without diagnosis of hypertension: Secondary | ICD-10-CM | POA: Insufficient documentation

## 2014-09-15 LAB — FLUORESCENT TREPONEMAL AB(FTA)-IGG-BLD: Fluorescent Treponemal ABS: REACTIVE — AB

## 2014-09-15 LAB — T-HELPER CELL (CD4) - (RCID CLINIC ONLY)
CD4 % Helper T Cell: 37 % (ref 33–55)
CD4 T Cell Abs: 840 /uL (ref 400–2700)

## 2014-09-15 LAB — HIV-1 RNA QUANT-NO REFLEX-BLD: HIV 1 RNA Quant: <20 copies/mL (ref <20)

## 2014-09-15 LAB — RPR TITER: RPR Titer: 1:8 {titer}

## 2014-09-15 LAB — RPR: RPR Ser Ql: REACTIVE — AB

## 2014-09-29 ENCOUNTER — Other Ambulatory Visit: Payer: Self-pay | Admitting: Internal Medicine

## 2014-09-29 DIAGNOSIS — B20 Human immunodeficiency virus [HIV] disease: Secondary | ICD-10-CM

## 2014-12-18 ENCOUNTER — Ambulatory Visit: Payer: Self-pay | Admitting: Internal Medicine

## 2014-12-25 ENCOUNTER — Ambulatory Visit (INDEPENDENT_AMBULATORY_CARE_PROVIDER_SITE_OTHER): Payer: Self-pay | Admitting: Internal Medicine

## 2014-12-25 VITALS — BP 148/84 | HR 90 | Temp 98.7°F | Resp 16 | Ht 70.0 in | Wt 224.0 lb

## 2014-12-25 DIAGNOSIS — B20 Human immunodeficiency virus [HIV] disease: Secondary | ICD-10-CM

## 2014-12-25 DIAGNOSIS — Z8619 Personal history of other infectious and parasitic diseases: Secondary | ICD-10-CM

## 2014-12-25 DIAGNOSIS — R634 Abnormal weight loss: Secondary | ICD-10-CM

## 2014-12-25 DIAGNOSIS — R030 Elevated blood-pressure reading, without diagnosis of hypertension: Secondary | ICD-10-CM

## 2014-12-25 DIAGNOSIS — Z79899 Other long term (current) drug therapy: Secondary | ICD-10-CM

## 2014-12-25 DIAGNOSIS — Z113 Encounter for screening for infections with a predominantly sexual mode of transmission: Secondary | ICD-10-CM

## 2014-12-25 LAB — CBC WITH DIFFERENTIAL/PLATELET
BASOS ABS: 0 10*3/uL (ref 0.0–0.1)
BASOS PCT: 0 % (ref 0–1)
EOS ABS: 0.1 10*3/uL (ref 0.0–0.7)
EOS PCT: 2 % (ref 0–5)
HCT: 45.3 % (ref 39.0–52.0)
HEMOGLOBIN: 14.5 g/dL (ref 13.0–17.0)
Lymphocytes Relative: 32 % (ref 12–46)
Lymphs Abs: 2.2 10*3/uL (ref 0.7–4.0)
MCH: 23 pg — AB (ref 26.0–34.0)
MCHC: 32 g/dL (ref 30.0–36.0)
MCV: 71.8 fL — ABNORMAL LOW (ref 78.0–100.0)
MONOS PCT: 12 % (ref 3–12)
MPV: 8.8 fL (ref 8.6–12.4)
Monocytes Absolute: 0.8 10*3/uL (ref 0.1–1.0)
NEUTROS ABS: 3.8 10*3/uL (ref 1.7–7.7)
Neutrophils Relative %: 54 % (ref 43–77)
Platelets: 240 10*3/uL (ref 150–400)
RBC: 6.31 MIL/uL — ABNORMAL HIGH (ref 4.22–5.81)
RDW: 14.3 % (ref 11.5–15.5)
WBC: 7 10*3/uL (ref 4.0–10.5)

## 2014-12-25 MED ORDER — EMTRICITABINE-TENOFOVIR AF 200-25 MG PO TABS: 1.0000 | ORAL_TABLET | Freq: Every day | ORAL | Status: DC

## 2014-12-25 NOTE — Progress Notes (Signed)
Patient ID: Ryan Martinez, male   DOB: 1990/07/08, 24 y.o.   MRN: [id number]       Patient ID: Ryan Martinez, male   DOB: 1990/07/08, 24 y.o.   MRN: [id number]  HPI 24yo M with HIV disease, CD 4 count 840/VL<20 (april) on complera. Hx of secondary syphilis. Not sexually active. Doing well with meds. No problems with health. He has been losing weight intentionally with diet modificaiton and exercise. Down to 224 from 240 over 3 months  Outpatient Encounter Prescriptions as of 12/25/2014  Medication Sig  . COMPLERA 200-25-300 MG TABS TAKE 1 TABLET BY MOUTH EVERY DAY  . mupirocin ointment (BACTROBAN) 2 % Place 1 application into the nose 3 (three) times daily. X 14 days. Can do generic (Patient not taking: Reported on 12/25/2014)   No facility-administered encounter medications on file as of 12/25/2014.     Patient Active Problem List   Diagnosis Date Noted  . Pre-hypertension 09/15/2014  . History of MRSA infection 07/07/2011  . Human immunodeficiency virus (HIV) disease 05/16/2011     Health Maintenance Due  Topic Date Due  . TETANUS/TDAP  10/18/2010     Review of Systems 10 point ros is negative Physical Exam   BP 148/84 mmHg  Pulse 90  Temp(Src) 98.7 F (37.1 C) (Oral)  Resp 16  Ht 5\' 10"  (1.778 m)  Wt 224 lb (101.606 kg)  BMI 32.14 kg/m2 Physical Exam  Constitutional: He is oriented to person, place, and time. He appears well-developed and well-nourished. No distress.  HENT:  Mouth/Throat: Oropharynx is clear and moist. No oropharyngeal exudate.  Cardiovascular: Normal rate, regular rhythm and normal heart sounds. Exam reveals no gallop and no friction rub.  No murmur heard.  Pulmonary/Chest: Effort normal and breath sounds normal. No respiratory distress. He has no wheezes.  Lymphadenopathy:  He has no cervical adenopathy.  Skin: Skin is warm and dry. No rash noted. No erythema.  Psychiatric: He has a normal mood and affect. His behavior is normal.     Lab Results  Component Value Date   CD4TCELL 37 09/14/2014   Lab Results  Component Value Date   CD4TABS 840 09/14/2014   CD4TABS 690 08/07/2014   CD4TABS 1170 01/27/2014   Lab Results  Component Value Date   HIV1RNAQUANT <20 09/14/2014   Lab Results  Component Value Date   HEPBSAB NEG 05/13/2011   No results found for: RPR  CBC Lab Results  Component Value Date   WBC 5.6 08/07/2014   RBC 6.27* 08/07/2014   HGB 14.3 08/07/2014   HCT 46.5 08/07/2014   PLT 239 08/07/2014   MCV 74.2* 08/07/2014   MCH 22.8* 08/07/2014   MCHC 30.8 08/07/2014   RDW 14.8 08/07/2014   LYMPHSABS 1.8 08/07/2014   MONOABS 0.7 08/07/2014   EOSABS 0.1 08/07/2014   BASOSABS 0.0 08/07/2014   BMET Lab Results  Component Value Date   NA 140 08/07/2014   K 4.0 08/07/2014   CL 103 08/07/2014   CO2 22 08/07/2014   GLUCOSE 89 08/07/2014   BUN 13 08/07/2014   CREATININE 1.18 08/07/2014   CALCIUM 9.1 08/07/2014   GFRNONAA >89 09/08/2012   GFRAA >89 09/08/2012     Assessment and Plan  hiv disease = will repeat labs today. Switch to descovey  History of syphilis = will repeat rpr  Health maintenance = will check lipids  Weight loss = agree with continued diet modification and exercise. Goal to be at 205 per patient  Pre-hypertension = appears improving; likely to improve with weight loss

## 2014-12-26 LAB — COMPLETE METABOLIC PANEL WITH GFR
ALT: 16 U/L (ref 0–53)
AST: 15 U/L (ref 0–37)
Albumin: 4.1 g/dL (ref 3.5–5.2)
Alkaline Phosphatase: 77 U/L (ref 39–117)
BUN: 13 mg/dL (ref 6–23)
CHLORIDE: 104 meq/L (ref 96–112)
CO2: 20 mEq/L (ref 19–32)
CREATININE: 1.06 mg/dL (ref 0.50–1.35)
Calcium: 9.5 mg/dL (ref 8.4–10.5)
GFR, Est Non African American: >89 mL/min
GLUCOSE: 91 mg/dL (ref 70–99)
Potassium: 4 mEq/L (ref 3.5–5.3)
Sodium: 140 mEq/L (ref 135–145)
TOTAL PROTEIN: 7.4 g/dL (ref 6.0–8.3)
Total Bilirubin: 1 mg/dL (ref 0.2–1.2)

## 2014-12-26 LAB — HIV-1 RNA QUANT-NO REFLEX-BLD: HIV-1 RNA Quant, Log: <1.3 {Log} (ref <1.30)

## 2014-12-26 LAB — FLUORESCENT TREPONEMAL AB(FTA)-IGG-BLD: Fluorescent Treponemal ABS: REACTIVE — AB

## 2014-12-26 LAB — LIPID PANEL
CHOLESTEROL: 139 mg/dL (ref 0–200)
HDL: 36 mg/dL — ABNORMAL LOW (ref >=40)
LDL CALC: 77 mg/dL (ref 0–99)
TRIGLYCERIDES: 128 mg/dL (ref <150)
Total CHOL/HDL Ratio: 3.9 Ratio
VLDL: 26 mg/dL (ref 0–40)

## 2014-12-26 LAB — RPR: RPR: REACTIVE — AB

## 2014-12-26 LAB — RPR TITER: RPR Titer: 1:8 {titer}

## 2015-02-02 ENCOUNTER — Telehealth: Payer: Self-pay | Admitting: *Deleted

## 2015-02-02 NOTE — Telephone Encounter (Signed)
Pharmacy called for clarification. They wanted to let us know that the patient picked up complera at last refill instead of new prescription of descovy.  Please advise if patient was supposed to be switched to [facility] to replace complera. Andree Coss, RN

## 2015-02-05 NOTE — Telephone Encounter (Signed)
Switching to [facility] -

## 2015-02-06 ENCOUNTER — Other Ambulatory Visit: Payer: Self-pay | Admitting: *Deleted

## 2015-02-06 MED ORDER — EMTRICITAB-RILPIVIR-TENOFOV AF 200-25-25 MG PO TABS: 1.0000 | ORAL_TABLET | Freq: Every day | ORAL | Status: DC

## 2015-02-06 NOTE — Telephone Encounter (Signed)
Sent. Notified pharmacy.  Thank you!

## 2015-04-23 ENCOUNTER — Other Ambulatory Visit: Payer: Self-pay | Admitting: Internal Medicine

## 2015-04-23 ENCOUNTER — Other Ambulatory Visit (INDEPENDENT_AMBULATORY_CARE_PROVIDER_SITE_OTHER): Payer: Self-pay

## 2015-04-23 DIAGNOSIS — B20 Human immunodeficiency virus [HIV] disease: Secondary | ICD-10-CM

## 2015-04-23 LAB — CBC WITH DIFFERENTIAL/PLATELET
Basophils Absolute: 0.1 10*3/uL (ref 0.0–0.1)
Basophils Relative: 1 % (ref 0–1)
EOS ABS: 0.2 10*3/uL (ref 0.0–0.7)
Eosinophils Relative: 3 % (ref 0–5)
HCT: 49.2 % (ref 39.0–52.0)
HEMOGLOBIN: 15.8 g/dL (ref 13.0–17.0)
LYMPHS ABS: 1.7 10*3/uL (ref 0.7–4.0)
Lymphocytes Relative: 29 % (ref 12–46)
MCH: 23.5 pg — AB (ref 26.0–34.0)
MCHC: 32.1 g/dL (ref 30.0–36.0)
MCV: 73.2 fL — AB (ref 78.0–100.0)
MONO ABS: 0.6 10*3/uL (ref 0.1–1.0)
MONOS PCT: 10 % (ref 3–12)
MPV: 9.3 fL (ref 8.6–12.4)
NEUTROS PCT: 57 % (ref 43–77)
Neutro Abs: 3.3 10*3/uL (ref 1.7–7.7)
PLATELETS: 245 10*3/uL (ref 150–400)
RBC: 6.72 MIL/uL — ABNORMAL HIGH (ref 4.22–5.81)
RDW: 13.5 % (ref 11.5–15.5)
WBC: 5.8 10*3/uL (ref 4.0–10.5)

## 2015-04-23 LAB — COMPREHENSIVE METABOLIC PANEL
ALBUMIN: 4.3 g/dL (ref 3.6–5.1)
ALT: 17 U/L (ref 9–46)
AST: 18 U/L (ref 10–40)
Alkaline Phosphatase: 78 U/L (ref 40–115)
BUN: 10 mg/dL (ref 7–25)
CHLORIDE: 102 mmol/L (ref 98–110)
CO2: 28 mmol/L (ref 20–31)
Calcium: 9.5 mg/dL (ref 8.6–10.3)
Creat: 1.05 mg/dL (ref 0.60–1.35)
Glucose, Bld: 108 mg/dL — ABNORMAL HIGH (ref 65–99)
POTASSIUM: 4.1 mmol/L (ref 3.5–5.3)
Sodium: 139 mmol/L (ref 135–146)
TOTAL PROTEIN: 7.4 g/dL (ref 6.1–8.1)
Total Bilirubin: 1.1 mg/dL (ref 0.2–1.2)

## 2015-04-23 NOTE — Addendum Note (Signed)
Addended by: Mariea ClontsGREEN, Angelisse Riso D on: 04/23/2015 04:00 PM   Modules accepted: Orders

## 2015-04-24 LAB — T-HELPER CELL (CD4) - (RCID CLINIC ONLY)
CD4 T CELL HELPER: 39 % (ref 33–55)
CD4 T Cell Abs: 710 /uL (ref 400–2700)

## 2015-04-25 LAB — HIV-1 RNA QUANT-NO REFLEX-BLD

## 2015-05-10 ENCOUNTER — Ambulatory Visit (INDEPENDENT_AMBULATORY_CARE_PROVIDER_SITE_OTHER): Payer: Self-pay | Admitting: Internal Medicine

## 2015-05-10 ENCOUNTER — Encounter: Payer: Self-pay | Admitting: Internal Medicine

## 2015-05-10 VITALS — BP 133/88 | HR 88 | Temp 97.5°F | Wt 210.0 lb

## 2015-05-10 DIAGNOSIS — B20 Human immunodeficiency virus [HIV] disease: Secondary | ICD-10-CM

## 2015-05-10 DIAGNOSIS — Z23 Encounter for immunization: Secondary | ICD-10-CM

## 2015-05-10 LAB — SYPHILIS: RPR W/REFLEX TO RPR TITER AND TREPONEMAL ANTIBODIES, TRADITIONAL SCREENING AND DIAGNOSIS ALGORITHM: RPR Ser Ql: REACTIVE — AB

## 2015-05-10 LAB — RPR TITER

## 2015-05-10 NOTE — Progress Notes (Signed)
Patient ID: Ryan Martinez, male   DOB: 1990/07/20, 24 y.o.   MRN: [id number]       Patient ID: Ryan Martinez, male   DOB: 1990/07/20, 24 y.o.   MRN: [id number]  HPI 24yo M with HIV disease ,CD 4 count of 710/VL<20 on odefsey. Doing well with meds. He had a cold last week but now improved. He has intentionally lost 30# over the last 11 months. His goal is to drop an addn 10 lb. He is doing well. No unprotected sex lately.  Outpatient Encounter Prescriptions as of 05/10/2015  Medication Sig  . emtricitabine-rilpivir-tenofovir AF (ODEFSEY) 200-25-25 MG TABS per tablet Take 1 tablet by mouth daily with breakfast.  . mupirocin ointment (BACTROBAN) 2 % Place 1 application into the nose 3 (three) times daily. X 14 days. Can do generic   No facility-administered encounter medications on file as of 05/10/2015.     Patient Active Problem List   Diagnosis Date Noted  . Pre-hypertension 09/15/2014  . History of MRSA infection 07/07/2011  . Human immunodeficiency virus (HIV) disease (HCC) 05/16/2011     Health Maintenance Due  Topic Date Due  . TETANUS/TDAP  10/18/2010  . INFLUENZA VACCINE  01/08/2015     Review of Systems  Constitutional: Negative for fever, chills, diaphoresis, activity change, appetite change, fatigue and unexpected weight change.  HENT: Negative for congestion, sore throat, rhinorrhea, sneezing, trouble swallowing and sinus pressure.  Eyes: Negative for photophobia and visual disturbance.  Respiratory: Negative for cough, chest tightness, shortness of breath, wheezing and stridor.  Cardiovascular: Negative for chest pain, palpitations and leg swelling.  Gastrointestinal: Negative for nausea, vomiting, abdominal pain, diarrhea, constipation, blood in stool, abdominal distention and anal bleeding.  Genitourinary: Negative for dysuria, hematuria, flank pain and difficulty urinating.  Musculoskeletal: Negative for myalgias, back pain, joint swelling, arthralgias  and gait problem.  Skin: Negative for color change, pallor, rash and wound.  Neurological: Negative for dizziness, tremors, weakness and light-headedness.  Hematological: Negative for adenopathy. Does not bruise/bleed easily.  Psychiatric/Behavioral: Negative for behavioral problems, confusion, sleep disturbance, dysphoric mood, decreased concentration and agitation.    Physical Exam   BP 133/88 mmHg  Pulse 88  Temp(Src) 97.5 F (36.4 C) (Oral)  Wt 210 lb (95.255 kg) Physical Exam  Constitutional: He is oriented to person, place, and time. He appears well-developed and well-nourished. No distress.  HENT:  Mouth/Throat: Oropharynx is clear and moist. No oropharyngeal exudate.  Cardiovascular: Normal rate, regular rhythm and normal heart sounds. Exam reveals no gallop and no friction rub.  No murmur heard.  Pulmonary/Chest: Effort normal and breath sounds normal. No respiratory distress. He has no wheezes.  GU: small anal fissure. No visible anal warts Lymphadenopathy:  He has no cervical adenopathy.  Neurological: He is alert and oriented to person, place, and time.  Skin: Skin is warm and dry. No rash noted. No erythema.  Psychiatric: He has a normal mood and affect. His behavior is normal.    Lab Results  Component Value Date   CD4TCELL 39 04/23/2015   Lab Results  Component Value Date   CD4TABS 710 04/23/2015   CD4TABS 840 09/14/2014   CD4TABS 690 08/07/2014   Lab Results  Component Value Date   HIV1RNAQUANT <20 04/23/2015   Lab Results  Component Value Date   HEPBSAB NEG 05/13/2011   No results found for: RPR  CBC Lab Results  Component Value Date   WBC 5.8 04/23/2015   RBC 6.72* 04/23/2015  HGB 15.8 04/23/2015   HCT 49.2 04/23/2015   PLT 245 04/23/2015   MCV 73.2* 04/23/2015   MCH 23.5* 04/23/2015   MCHC 32.1 04/23/2015   RDW 13.5 04/23/2015   LYMPHSABS 1.7 04/23/2015   MONOABS 0.6 04/23/2015   EOSABS 0.2 04/23/2015   BASOSABS 0.1 04/23/2015    BMET Lab Results  Component Value Date   NA 139 04/23/2015   K 4.1 04/23/2015   CL 102 04/23/2015   CO2 28 04/23/2015   GLUCOSE 108* 04/23/2015   BUN 10 04/23/2015   CREATININE 1.05 04/23/2015   CALCIUM 9.5 04/23/2015   GFRNONAA >89 12/25/2014   GFRAA >89 12/25/2014     Assessment and Plan   hiv disease = continue on current regimen  Health maintenance = gave flu shot  Hx of syphilis = need to check rpr and swab for GC/chlam screening

## 2015-05-11 LAB — CYTOLOGY, (ORAL, ANAL, URETHRAL) ANCILLARY ONLY
CHLAMYDIA, DNA PROBE: NEGATIVE
CHLAMYDIA, DNA PROBE: NEGATIVE
Chlamydia: NEGATIVE
NEISSERIA GONORRHEA: NEGATIVE
NEISSERIA GONORRHEA: NEGATIVE
Neisseria Gonorrhea: NEGATIVE

## 2015-05-14 LAB — FLUORESCENT TREPONEMAL AB(FTA)-IGG-BLD: FLUORESCENT TREPONEMAL ABS: REACTIVE — AB

## 2015-10-02 ENCOUNTER — Other Ambulatory Visit: Payer: Self-pay | Admitting: Internal Medicine

## 2015-10-02 DIAGNOSIS — B20 Human immunodeficiency virus [HIV] disease: Secondary | ICD-10-CM

## 2015-10-03 ENCOUNTER — Other Ambulatory Visit: Payer: Self-pay | Admitting: *Deleted

## 2015-10-03 DIAGNOSIS — B20 Human immunodeficiency virus [HIV] disease: Secondary | ICD-10-CM

## 2015-10-03 MED ORDER — EMTRICITAB-RILPIVIR-TENOFOV AF 200-25-25 MG PO TABS: 1.0000 | ORAL_TABLET | Freq: Every day | ORAL | Status: DC

## 2015-10-24 ENCOUNTER — Other Ambulatory Visit: Payer: Self-pay

## 2015-10-24 DIAGNOSIS — Z113 Encounter for screening for infections with a predominantly sexual mode of transmission: Secondary | ICD-10-CM

## 2015-10-24 DIAGNOSIS — B20 Human immunodeficiency virus [HIV] disease: Secondary | ICD-10-CM

## 2015-10-24 LAB — COMPLETE METABOLIC PANEL WITH GFR
ALBUMIN: 4.1 g/dL (ref 3.6–5.1)
ALK PHOS: 65 U/L (ref 40–115)
ALT: 33 U/L (ref 9–46)
AST: 21 U/L (ref 10–40)
BILIRUBIN TOTAL: 1 mg/dL (ref 0.2–1.2)
BUN: 14 mg/dL (ref 7–25)
CALCIUM: 9 mg/dL (ref 8.6–10.3)
CO2: 25 mmol/L (ref 20–31)
CREATININE: 1.12 mg/dL (ref 0.60–1.35)
Chloride: 105 mmol/L (ref 98–110)
GFR, Est African American: >89 mL/min (ref >=60)
GFR, Est Non African American: >89 mL/min (ref >=60)
GLUCOSE: 113 mg/dL — AB (ref 65–99)
Potassium: 4.5 mmol/L (ref 3.5–5.3)
SODIUM: 138 mmol/L (ref 135–146)
TOTAL PROTEIN: 7.2 g/dL (ref 6.1–8.1)

## 2015-10-24 LAB — CBC WITH DIFFERENTIAL/PLATELET
BASOS ABS: 0 {cells}/uL (ref 0–200)
Basophils Relative: 0 %
EOS PCT: 3 %
Eosinophils Absolute: 201 cells/uL (ref 15–500)
HEMATOCRIT: 47.8 % (ref 38.5–50.0)
HEMOGLOBIN: 15 g/dL (ref 13.2–17.1)
LYMPHS ABS: 2077 {cells}/uL (ref 850–3900)
Lymphocytes Relative: 31 %
MCH: 23.3 pg — ABNORMAL LOW (ref 27.0–33.0)
MCHC: 31.4 g/dL — ABNORMAL LOW (ref 32.0–36.0)
MCV: 74.1 fL — ABNORMAL LOW (ref 80.0–100.0)
MONO ABS: 670 {cells}/uL (ref 200–950)
MPV: 9.3 fL (ref 7.5–12.5)
Monocytes Relative: 10 %
NEUTROS ABS: 3752 {cells}/uL (ref 1500–7800)
NEUTROS PCT: 56 %
Platelets: 252 10*3/uL (ref 140–400)
RBC: 6.45 MIL/uL — AB (ref 4.20–5.80)
RDW: 14.7 % (ref 11.0–15.0)
WBC: 6.7 10*3/uL (ref 3.8–10.8)

## 2015-10-25 LAB — RPR: RPR: REACTIVE — AB

## 2015-10-25 LAB — T-HELPER CELL (CD4) - (RCID CLINIC ONLY)
CD4 T CELL HELPER: 44 % (ref 33–55)
CD4 T Cell Abs: 960 /uL (ref 400–2700)

## 2015-10-25 LAB — HIV-1 RNA QUANT-NO REFLEX-BLD
HIV 1 RNA Quant: <20 copies/mL (ref <20)
HIV-1 RNA Quant, Log: <1.3 Log copies/mL (ref <1.30)

## 2015-10-25 LAB — FLUORESCENT TREPONEMAL AB(FTA)-IGG-BLD: FLUORESCENT TREPONEMAL ABS: REACTIVE — AB

## 2015-10-25 LAB — RPR TITER

## 2015-10-25 LAB — URINE CYTOLOGY ANCILLARY ONLY
Chlamydia: NEGATIVE
Neisseria Gonorrhea: NEGATIVE

## 2015-11-06 ENCOUNTER — Encounter: Payer: Self-pay | Admitting: Internal Medicine

## 2015-11-07 ENCOUNTER — Encounter: Payer: Self-pay | Admitting: Internal Medicine

## 2015-11-08 ENCOUNTER — Ambulatory Visit (INDEPENDENT_AMBULATORY_CARE_PROVIDER_SITE_OTHER): Payer: Self-pay | Admitting: Internal Medicine

## 2015-11-08 ENCOUNTER — Encounter: Payer: Self-pay | Admitting: Internal Medicine

## 2015-11-08 ENCOUNTER — Telehealth: Payer: Self-pay | Admitting: *Deleted

## 2015-11-08 ENCOUNTER — Other Ambulatory Visit: Payer: Self-pay | Admitting: *Deleted

## 2015-11-08 VITALS — BP 132/91 | HR 84 | Temp 98.1°F | Ht 70.0 in | Wt 223.0 lb

## 2015-11-08 DIAGNOSIS — Z23 Encounter for immunization: Secondary | ICD-10-CM

## 2015-11-08 DIAGNOSIS — B20 Human immunodeficiency virus [HIV] disease: Secondary | ICD-10-CM

## 2015-11-08 DIAGNOSIS — Z8619 Personal history of other infectious and parasitic diseases: Secondary | ICD-10-CM | POA: Insufficient documentation

## 2015-11-08 DIAGNOSIS — H16001 Unspecified corneal ulcer, right eye: Secondary | ICD-10-CM

## 2015-11-08 DIAGNOSIS — Z0000 Encounter for general adult medical examination without abnormal findings: Secondary | ICD-10-CM

## 2015-11-08 MED ORDER — EMTRICITAB-RILPIVIR-TENOFOV AF 200-25-25 MG PO TABS: 1.0000 | ORAL_TABLET | Freq: Every day | ORAL | Status: DC

## 2015-11-08 NOTE — Telephone Encounter (Signed)
Patient now has insurance and, [facility] must [facility] to fill his odefsey.  Patient given phone number to [facility] to set up his account, given copay card to activate. Patient verbalized understanding when instructed how to fill his prescriptions via his insurance. Prescription sent to [facility].  Pharmacy preference updated in system.  Insurance card copied and given to Liberty MutualDiane Smith-Foster for chart update. Andree CossHowell, Kendrick Haapala M, RN

## 2015-11-08 NOTE — Progress Notes (Signed)
Patient ID: Ryan Martinez, male   DOB: 10-08-1990, 25 y.o.   MRN: [id number]       Patient ID: Ryan Martinez, male   DOB: 10-08-1990, 25 y.o.   MRN: [id number]  HPI 25yo M with HIV disease, CD 4 count 960/VL<20, on odefsey. Doing well with adherence. In late April, he was diagnosed with corneal ulcer htat he has been receiving steroids drops, now much improved though still has impaired vision to right eye. No other recent illnesses. Last sexually active in nov 2016. Working at [employer], delivery  Outpatient Encounter Prescriptions as of 11/08/2015  Medication Sig  . emtricitabine-rilpivir-tenofovir AF (ODEFSEY) 200-25-25 MG TABS tablet Take 1 tablet by mouth daily with breakfast.  . prednisoLONE acetate (PRED FORTE) 1 % ophthalmic suspension   . [DISCONTINUED] mupirocin ointment (BACTROBAN) 2 % Place 1 application into the nose 3 (three) times daily. X 14 days. Can do generic   No facility-administered encounter medications on file as of 11/08/2015.     Patient Active Problem List   Diagnosis Date Noted  . Pre-hypertension 09/15/2014  . History of MRSA infection 07/07/2011  . Human immunodeficiency virus (HIV) disease (HCC) 05/16/2011     Health Maintenance Due  Topic Date Due  . TETANUS/TDAP  10/18/2010     Review of Systems  Constitutional: Negative for fever, chills, diaphoresis, activity change, appetite change, fatigue and unexpected weight change.  HENT: Negative for congestion, sore throat, rhinorrhea, sneezing, trouble swallowing and sinus pressure.  Eyes: + blurry vision. Negative for photophobia and visual disturbance.  Respiratory: Negative for cough, chest tightness, shortness of breath, wheezing and stridor.  Cardiovascular: Negative for chest pain, palpitations and leg swelling.  Gastrointestinal: Negative for nausea, vomiting, abdominal pain, diarrhea, constipation, blood in stool, abdominal distention and anal bleeding.  Genitourinary: Negative for  dysuria, hematuria, flank pain and difficulty urinating.  Musculoskeletal: Negative for myalgias, back pain, joint swelling, arthralgias and gait problem.  Skin: Negative for color change, pallor, rash and wound.  Neurological: Negative for dizziness, tremors, weakness and light-headedness.  Hematological: Negative for adenopathy. Does not bruise/bleed easily.  Psychiatric/Behavioral: Negative for behavioral problems, confusion, sleep disturbance, dysphoric mood, decreased concentration and agitation.    Physical Exam   BP 132/91 mmHg  Pulse 84  Temp(Src) 98.1 F (36.7 C) (Oral)  Ht 5\' 10"  (1.778 m)  Wt 223 lb (101.152 kg)  BMI 32.00 kg/m2 Physical Exam  Constitutional: He is oriented to person, place, and time. He appears well-developed and well-nourished. No distress.  HENT: OD: cornea has small circular opaque lesion centrally located. Mouth/Throat: Oropharynx is clear and moist. No oropharyngeal exudate.  Cardiovascular: Normal rate, regular rhythm and normal heart sounds. Exam reveals no gallop and no friction rub.  No murmur heard.  Pulmonary/Chest: Effort normal and breath sounds normal. No respiratory distress. He has no wheezes.  Abdominal: Soft. Bowel sounds are normal. He exhibits no distension. There is no tenderness.  Lymphadenopathy:  He has no cervical adenopathy.  Neurological: He is alert and oriented to person, place, and time.  Skin: Skin is warm and dry. No rash noted. No erythema.  Psychiatric: He has a normal mood and affect. His behavior is normal.    Lab Results  Component Value Date   CD4TCELL 44 10/24/2015   Lab Results  Component Value Date   CD4TABS 960 10/24/2015   CD4TABS 710 04/23/2015   CD4TABS 840 09/14/2014   Lab Results  Component Value Date   HIV1RNAQUANT <20 10/24/2015  Lab Results  Component Value Date   HEPBSAB NEG 05/13/2011   No results found for: RPR  CBC Lab Results  Component Value Date   WBC 6.7 10/24/2015   RBC  6.45* 10/24/2015   HGB 15.0 10/24/2015   HCT 47.8 10/24/2015   PLT 252 10/24/2015   MCV 74.1* 10/24/2015   MCH 23.3* 10/24/2015   MCHC 31.4* 10/24/2015   RDW 14.7 10/24/2015   LYMPHSABS 2077 10/24/2015   MONOABS 670 10/24/2015   EOSABS 201 10/24/2015   BASOSABS 0 10/24/2015   BMET Lab Results  Component Value Date   NA 138 10/24/2015   K 4.5 10/24/2015   CL 105 10/24/2015   CO2 25 10/24/2015   GLUCOSE 113* 10/24/2015   BUN 14 10/24/2015   CREATININE 1.12 10/24/2015   CALCIUM 9.0 10/24/2015   GFRNONAA >89 10/24/2015   GFRAA >89 10/24/2015     Assessment and Plan  Hiv disease = will continue with odefsey. Needs to get adap application and emergency access  Health maintenance = will give hep a, # 2 and hpv #1  Corneal ulcer = continue with steroid drops and upcoming visit with ophtho

## 2015-11-15 NOTE — Addendum Note (Signed)
Addended by: Andree CossHOWELL, MICHELLE M on: 11/15/2015 03:00 PM   Modules accepted: Orders

## 2016-01-08 ENCOUNTER — Ambulatory Visit (INDEPENDENT_AMBULATORY_CARE_PROVIDER_SITE_OTHER): Payer: Commercial Managed Care - HMO | Admitting: *Deleted

## 2016-01-08 DIAGNOSIS — Z23 Encounter for immunization: Secondary | ICD-10-CM | POA: Diagnosis not present

## 2016-01-08 NOTE — Patient Instructions (Signed)
Pt needs to return in 4 months for the last HPV vaccine.

## 2016-04-28 ENCOUNTER — Other Ambulatory Visit: Payer: Self-pay

## 2016-05-12 ENCOUNTER — Ambulatory Visit: Payer: Self-pay | Admitting: Internal Medicine

## 2016-06-10 ENCOUNTER — Other Ambulatory Visit (HOSPITAL_COMMUNITY)
Admission: RE | Admit: 2016-06-10 | Discharge: 2016-06-10 | Disposition: A | Discharge status: Home / Self Care | Payer: Commercial Managed Care - HMO | Source: Ambulatory Visit | Attending: Internal Medicine | Admitting: Internal Medicine

## 2016-06-10 ENCOUNTER — Other Ambulatory Visit: Payer: Commercial Managed Care - HMO

## 2016-06-10 DIAGNOSIS — Z113 Encounter for screening for infections with a predominantly sexual mode of transmission: Secondary | ICD-10-CM

## 2016-06-10 DIAGNOSIS — B20 Human immunodeficiency virus [HIV] disease: Secondary | ICD-10-CM

## 2016-06-10 DIAGNOSIS — Z79899 Other long term (current) drug therapy: Secondary | ICD-10-CM

## 2016-06-10 LAB — CBC WITH DIFFERENTIAL/PLATELET
Basophils Absolute: 0 cells/uL (ref 0–200)
Basophils Relative: 0 %
EOS PCT: 3 %
Eosinophils Absolute: 201 cells/uL (ref 15–500)
HEMATOCRIT: 48.1 % (ref 38.5–50.0)
HEMOGLOBIN: 15.1 g/dL (ref 13.2–17.1)
LYMPHS ABS: 1876 {cells}/uL (ref 850–3900)
Lymphocytes Relative: 28 %
MCH: 23.8 pg — ABNORMAL LOW (ref 27.0–33.0)
MCHC: 31.4 g/dL — ABNORMAL LOW (ref 32.0–36.0)
MCV: 75.9 fL — ABNORMAL LOW (ref 80.0–100.0)
MONO ABS: 670 {cells}/uL (ref 200–950)
MPV: 9.5 fL (ref 7.5–12.5)
Monocytes Relative: 10 %
Neutro Abs: 3953 cells/uL (ref 1500–7800)
Neutrophils Relative %: 59 %
Platelets: 281 10*3/uL (ref 140–400)
RBC: 6.34 MIL/uL — AB (ref 4.20–5.80)
RDW: 14 % (ref 11.0–15.0)
WBC: 6.7 10*3/uL (ref 3.8–10.8)

## 2016-06-11 LAB — COMPLETE METABOLIC PANEL WITH GFR
ALT: 17 U/L (ref 9–46)
AST: 19 U/L (ref 10–40)
Albumin: 4.4 g/dL (ref 3.6–5.1)
Alkaline Phosphatase: 71 U/L (ref 40–115)
BUN: 14 mg/dL (ref 7–25)
CALCIUM: 9.3 mg/dL (ref 8.6–10.3)
CHLORIDE: 104 mmol/L (ref 98–110)
CO2: 28 mmol/L (ref 20–31)
Creat: 0.92 mg/dL (ref 0.60–1.35)
Glucose, Bld: 91 mg/dL (ref 65–99)
POTASSIUM: 4.3 mmol/L (ref 3.5–5.3)
Sodium: 139 mmol/L (ref 135–146)
Total Bilirubin: 0.9 mg/dL (ref 0.2–1.2)
Total Protein: 7.2 g/dL (ref 6.1–8.1)

## 2016-06-11 LAB — FLUORESCENT TREPONEMAL AB(FTA)-IGG-BLD: FLUORESCENT TREPONEMAL ABS: REACTIVE — AB

## 2016-06-11 LAB — LIPID PANEL
CHOL/HDL RATIO: 3.2 ratio (ref <5.0)
Cholesterol: 147 mg/dL (ref <200)
HDL: 46 mg/dL (ref >40)
LDL CALC: 84 mg/dL (ref <100)
TRIGLYCERIDES: 84 mg/dL (ref <150)
VLDL: 17 mg/dL (ref <30)

## 2016-06-11 LAB — RPR TITER

## 2016-06-11 LAB — URINE CYTOLOGY ANCILLARY ONLY
Chlamydia: NEGATIVE
Neisseria Gonorrhea: NEGATIVE

## 2016-06-11 LAB — RPR: RPR Ser Ql: REACTIVE — AB

## 2016-06-11 LAB — T-HELPER CELL (CD4) - (RCID CLINIC ONLY)
CD4 % Helper T Cell: 41 % (ref 33–55)
CD4 T Cell Abs: 760 /uL (ref 400–2700)

## 2016-06-12 LAB — HIV-1 RNA QUANT-NO REFLEX-BLD: HIV-1 RNA Quant, Log: <1.3 Log copies/mL (ref <1.30)

## 2016-06-18 ENCOUNTER — Encounter: Payer: Self-pay | Admitting: Internal Medicine

## 2016-07-10 ENCOUNTER — Encounter: Payer: Self-pay | Admitting: Internal Medicine

## 2016-07-10 ENCOUNTER — Ambulatory Visit (INDEPENDENT_AMBULATORY_CARE_PROVIDER_SITE_OTHER): Payer: Commercial Managed Care - HMO | Admitting: Internal Medicine

## 2016-07-10 VITALS — BP 155/93 | HR 78 | Temp 98.8°F | Ht 70.0 in | Wt 220.0 lb

## 2016-07-10 DIAGNOSIS — B20 Human immunodeficiency virus [HIV] disease: Secondary | ICD-10-CM | POA: Diagnosis not present

## 2016-07-10 DIAGNOSIS — R030 Elevated blood-pressure reading, without diagnosis of hypertension: Secondary | ICD-10-CM

## 2016-07-10 DIAGNOSIS — Z23 Encounter for immunization: Secondary | ICD-10-CM

## 2016-07-10 DIAGNOSIS — Z8619 Personal history of other infectious and parasitic diseases: Secondary | ICD-10-CM

## 2016-07-10 NOTE — Progress Notes (Signed)
RFV: hiv disease  Patient ID: Ryan Martinez, male   DOB: 24-Aug-1990, 26 y.o.   MRN: [id number]  HPI Ryan Martinez is a 26yo M with well controlled hiv disease. Has occ missed doses. Overall doing well. No problems with his health. He is in a new relationship with HIV negative male. They have discussed prep. Patient's partner unable to come today- gets tested every 6 months  Ryan Martinez has not had any other sexual partners other than his current in the last 6 months  Outpatient Encounter Prescriptions as of 07/10/2016  Medication Sig  . emtricitabine-rilpivir-tenofovir AF (ODEFSEY) 200-25-25 MG TABS tablet Take 1 tablet by mouth daily with breakfast.  . ibuprofen (ADVIL,MOTRIN) 800 MG tablet   . prednisoLONE acetate (PRED FORTE) 1 % ophthalmic suspension    No facility-administered encounter medications on file as of 07/10/2016.      Patient Active Problem List   Diagnosis Date Noted  . 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 History of syphilis 11/08/2015  . Pre-hypertension 09/15/2014  . History of MRSA infection 07/07/2011  . Human immunodeficiency virus (HIV) disease 05/16/2011     Health Maintenance Due  Topic Date Due  . TETANUS/TDAP  10/18/2010  . INFLUENZA VACCINE  01/08/2016     Review of Systems Review of Systems  Constitutional: Negative for fever, chills, diaphoresis, activity change, appetite change, fatigue and unexpected weight change.  HENT: Negative for congestion, sore throat, rhinorrhea, sneezing, trouble swallowing and sinus pressure.  Eyes: Negative for photophobia and visual disturbance.  Respiratory: Negative for cough, chest tightness, shortness of breath, wheezing and stridor.  Cardiovascular: Negative for chest pain, palpitations and leg swelling.  Gastrointestinal: Negative for nausea, vomiting, abdominal pain, diarrhea, constipation, blood in stool, abdominal distention and anal bleeding.  Genitourinary: Negative for dysuria, hematuria, flank pain  and difficulty urinating.  Musculoskeletal: Negative for myalgias, back pain, joint swelling, arthralgias and gait problem.  Skin: Negative for color change, pallor, rash and wound.  Neurological: Negative for dizziness, tremors, weakness and light-headedness.  Hematological: Negative for adenopathy. Does not bruise/bleed easily.  Psychiatric/Behavioral: Negative for behavioral problems, confusion, sleep disturbance, dysphoric mood, decreased concentration and agitation.    Physical Exam   BP (!) 155/93   Pulse 78   Temp 98.8 F (37.1 C) (Oral)   Ht 5\' 10"  (1.778 m)   Wt 220 lb (99.8 kg)   BMI 31.57 kg/m   Physical Exam  Constitutional: He is oriented to person, place, and time. He appears well-developed and well-nourished. No distress.  HENT:  Mouth/Throat: Oropharynx is clear and moist. No oropharyngeal exudate.  Cardiovascular: Normal rate, regular rhythm and normal heart sounds. Exam reveals no gallop and no friction rub.  No murmur heard.  Pulmonary/Chest: Effort normal and breath sounds normal. No respiratory distress. He has no wheezes.  Abdominal: Soft. Bowel sounds are normal. He exhibits no distension. There is no tenderness.  Lymphadenopathy:  He has no cervical adenopathy.  Neurological: He is alert and oriented to person, place, and time.  Skin: Skin is warm and dry. No rash noted. No erythema.  Psychiatric: He has a normal mood and affect. His behavior is normal.    Lab Results  Component Value Date   CD4TCELL 41 06/10/2016   Lab Results  Component Value Date   CD4TABS 760 06/10/2016   CD4TABS 960 10/24/2015   CD4TABS 710 04/23/2015   Lab Results  Component Value Date   HIV1RNAQUANT <20 06/10/2016   Lab Results  Component Value Date  HEPBSAB NEG 05/13/2011   Lab Results  Component Value Date   LABRPR REACTIVE (A) 06/10/2016    CBC Lab Results  Component Value Date   WBC 6.7 06/10/2016   RBC 6.34 (H) 06/10/2016   HGB 15.1 06/10/2016   HCT  48.1 06/10/2016   PLT 281 06/10/2016   MCV 75.9 (L) 06/10/2016   MCH 23.8 (L) 06/10/2016   MCHC 31.4 (L) 06/10/2016   RDW 14.0 06/10/2016   LYMPHSABS 1,876 06/10/2016   MONOABS 670 06/10/2016   EOSABS 201 06/10/2016    BMET Lab Results  Component Value Date   NA 139 06/10/2016   K 4.3 06/10/2016   CL 104 06/10/2016   CO2 28 06/10/2016   GLUCOSE 91 06/10/2016   BUN 14 06/10/2016   CREATININE 0.92 06/10/2016   CALCIUM 9.3 06/10/2016   GFRNONAA >89 06/10/2016   GFRAA >89 06/10/2016      Assessment and Plan  hiv disease= well controlled, continue on current regimen.  Health maintenance =will give flu and pneumococcal vaccine at this vist  Hx of syphilis = does not appear to have any new exposure, his blood work shows RPR 1:1. Will check blood work at next ivist  Pre-htn = will continue to follow his BP at next visit. Asked him to consider decrease salt intake  rtc in 3 months with partner to discuss prep

## 2016-09-23 ENCOUNTER — Other Ambulatory Visit: Payer: Commercial Managed Care - HMO

## 2016-09-23 DIAGNOSIS — Z113 Encounter for screening for infections with a predominantly sexual mode of transmission: Secondary | ICD-10-CM

## 2016-09-23 DIAGNOSIS — B20 Human immunodeficiency virus [HIV] disease: Secondary | ICD-10-CM

## 2016-09-24 LAB — T-HELPER CELL (CD4) - (RCID CLINIC ONLY)
CD4 % Helper T Cell: 39 % (ref 33–55)
CD4 T Cell Abs: 880 /uL (ref 400–2700)

## 2016-09-25 LAB — RPR

## 2016-09-25 LAB — HIV-1 RNA QUANT-NO REFLEX-BLD
HIV 1 RNA Quant: <20 copies/mL — AB
HIV-1 RNA QUANT, LOG: DETECTED {Log_copies}/mL — AB

## 2016-10-07 ENCOUNTER — Ambulatory Visit (INDEPENDENT_AMBULATORY_CARE_PROVIDER_SITE_OTHER): Payer: Commercial Managed Care - HMO | Admitting: Internal Medicine

## 2016-10-07 ENCOUNTER — Encounter: Payer: Self-pay | Admitting: Internal Medicine

## 2016-10-07 VITALS — BP 143/89 | HR 88 | Temp 98.6°F | Ht 70.6 in | Wt 227.0 lb

## 2016-10-07 DIAGNOSIS — Z23 Encounter for immunization: Secondary | ICD-10-CM | POA: Diagnosis not present

## 2016-10-07 DIAGNOSIS — B20 Human immunodeficiency virus [HIV] disease: Secondary | ICD-10-CM

## 2016-10-07 NOTE — Progress Notes (Signed)
RFV: hiv disease follow up  Patient ID: Ryan Martinez, male   DOB: 1990-08-02, 26 y.o.   MRN: [id number]  HPI Ryan Martinez is a 26yo M with HIV disease, CD 4 count of 880/VL<20 on odefsey. Doing well.he brought his partner, frank, with him to discuss their relationship - the patient is in a serodiscordant relationship.  No health issues since last seen in clinic  I have reviewed his records in epic.  Outpatient Encounter Prescriptions as of 10/07/2016  Medication Sig  . emtricitabine-rilpivir-tenofovir AF (ODEFSEY) 200-25-25 MG TABS tablet Take 1 tablet by mouth daily with breakfast.  . ibuprofen (ADVIL,MOTRIN) 800 MG tablet   . prednisoLONE acetate (PRED FORTE) 1 % ophthalmic suspension    No facility-administered encounter medications on file as of 10/07/2016.      Patient Active Problem List   Diagnosis Date Noted  . 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 History of syphilis 11/08/2015  . Pre-hypertension 09/15/2014  . History of MRSA infection 07/07/2011  . Human immunodeficiency virus (HIV) disease (HCC) 05/16/2011     Health Maintenance Due  Topic Date Due  . Janet Berlin  10/18/2010    Social History  Substance Use Topics  . Smoking status: Light Tobacco Smoker    Packs/day: 0.25    Years: 1.00    Types: Cigarettes  . Smokeless tobacco: Never Used     Comment: has set a quit date 06/09/13  . Alcohol use 0.0 oz/week    1 - 2 Shots of liquor per week  - monogamous relationship  Review of Systems Review of Systems  Constitutional: Negative for fever, chills, diaphoresis, activity change, appetite change, fatigue and unexpected weight change.  HENT: Negative for congestion, sore throat, rhinorrhea, sneezing, trouble swallowing and sinus pressure.  Eyes: Negative for photophobia and visual disturbance.  Respiratory: Negative for cough, chest tightness, shortness of breath, wheezing and stridor.  Cardiovascular: Negative for chest pain, palpitations and leg  swelling.  Gastrointestinal: Negative for nausea, vomiting, abdominal pain, diarrhea, constipation, blood in stool, abdominal distention and anal bleeding.  Genitourinary: Negative for dysuria, hematuria, flank pain and difficulty urinating.  Musculoskeletal: Negative for myalgias, back pain, joint swelling, arthralgias and gait problem.  Skin: Negative for color change, pallor, rash and wound.  Neurological: Negative for dizziness, tremors, weakness and light-headedness.  Hematological: Negative for adenopathy. Does not bruise/bleed easily.  Psychiatric/Behavioral: Negative for behavioral problems, confusion, sleep disturbance, dysphoric mood, decreased concentration and agitation.   Physical Exam  BP (!) 143/89   Pulse 88   Temp 98.6 F (37 C) (Oral)   Ht 5' 10.6" (1.793 m)   Wt 227 lb (103 kg)   BMI 32.02 kg/m  Physical Exam  Constitutional: He is oriented to person, place, and time. He appears well-developed and well-nourished. No distress.  HENT:  Mouth/Throat: Oropharynx is clear and moist. No oropharyngeal exudate.  Cardiovascular: Normal rate, regular rhythm and normal heart sounds. Exam reveals no gallop and no friction rub.  No murmur heard.  Pulmonary/Chest: Effort normal and breath sounds normal. No respiratory distress. He has no wheezes.  Lymphadenopathy:  He has no cervical adenopathy.  Neurological: He is alert and oriented to person, place, and time.  Skin: Skin is warm and dry. No rash noted. No erythema.  Psychiatric: He has a normal mood and affect. His behavior is normal.     Lab Results  Component Value Date   CD4TCELL 39 09/23/2016   Lab Results  Component Value Date   CD4TABS  880 09/23/2016   CD4TABS 760 06/10/2016   CD4TABS 960 10/24/2015   Lab Results  Component Value Date   HIV1RNAQUANT <20 DETECTED (A) 09/23/2016   Lab Results  Component Value Date   HEPBSAB NEG 05/13/2011   Lab Results  Component Value Date   LABRPR NON REAC 09/23/2016     CBC Lab Results  Component Value Date   WBC 6.7 06/10/2016   RBC 6.34 (H) 06/10/2016   HGB 15.1 06/10/2016   HCT 48.1 06/10/2016   PLT 281 06/10/2016   MCV 75.9 (L) 06/10/2016   MCH 23.8 (L) 06/10/2016   MCHC 31.4 (L) 06/10/2016   RDW 14.0 06/10/2016   LYMPHSABS 1,876 06/10/2016   MONOABS 670 06/10/2016   EOSABS 201 06/10/2016    BMET Lab Results  Component Value Date   NA 139 06/10/2016   K 4.3 06/10/2016   CL 104 06/10/2016   CO2 28 06/10/2016   GLUCOSE 91 06/10/2016   BUN 14 06/10/2016   CREATININE 0.92 06/10/2016   CALCIUM 9.3 06/10/2016   GFRNONAA >89 06/10/2016   GFRAA >89 06/10/2016      Assessment and Plan  hiv disease = well controlled  Health maintenance = will give hpv #3 and meningococcal vaccine  Unable to convince his partner to do prep at this time, they are in monogamous relationship. His partner is finding it difficult to take meds on daily basis.

## 2016-11-19 ENCOUNTER — Encounter: Payer: Self-pay | Admitting: Internal Medicine

## 2016-11-20 ENCOUNTER — Other Ambulatory Visit: Payer: Self-pay | Admitting: *Deleted

## 2016-11-20 DIAGNOSIS — B20 Human immunodeficiency virus [HIV] disease: Secondary | ICD-10-CM

## 2016-11-20 MED ORDER — EMTRICITAB-RILPIVIR-TENOFOV AF 200-25-25 MG PO TABS: 1.0000 | ORAL_TABLET | Freq: Every day | ORAL | 4 refills | Status: DC

## 2017-01-08 ENCOUNTER — Other Ambulatory Visit: Payer: Self-pay | Admitting: Emergency Medicine

## 2017-01-08 ENCOUNTER — Ambulatory Visit (INDEPENDENT_AMBULATORY_CARE_PROVIDER_SITE_OTHER): Payer: Self-pay

## 2017-01-08 DIAGNOSIS — S8992XD Unspecified injury of left lower leg, subsequent encounter: Secondary | ICD-10-CM

## 2017-01-08 DIAGNOSIS — M25462 Effusion, left knee: Secondary | ICD-10-CM

## 2017-03-26 ENCOUNTER — Other Ambulatory Visit (HOSPITAL_COMMUNITY)
Admission: RE | Admit: 2017-03-26 | Discharge: 2017-03-26 | Disposition: A | Discharge status: Home / Self Care | Payer: 59 | Source: Ambulatory Visit | Attending: Internal Medicine | Admitting: Internal Medicine

## 2017-03-26 ENCOUNTER — Other Ambulatory Visit: Payer: 59

## 2017-03-26 DIAGNOSIS — Z113 Encounter for screening for infections with a predominantly sexual mode of transmission: Secondary | ICD-10-CM | POA: Insufficient documentation

## 2017-03-26 DIAGNOSIS — B20 Human immunodeficiency virus [HIV] disease: Secondary | ICD-10-CM

## 2017-03-27 LAB — CBC WITH DIFFERENTIAL/PLATELET
Basophils Absolute: 30 cells/uL (ref 0–200)
Basophils Relative: 0.4 %
EOS ABS: 68 {cells}/uL (ref 15–500)
Eosinophils Relative: 0.9 %
HCT: 49 % (ref 38.5–50.0)
Hemoglobin: 15.1 g/dL (ref 13.2–17.1)
LYMPHS ABS: 1695 {cells}/uL (ref 850–3900)
MCH: 23.1 pg — ABNORMAL LOW (ref 27.0–33.0)
MCHC: 30.8 g/dL — ABNORMAL LOW (ref 32.0–36.0)
MCV: 74.9 fL — ABNORMAL LOW (ref 80.0–100.0)
MONOS PCT: 13.5 %
MPV: 9.3 fL (ref 7.5–12.5)
NEUTROS ABS: 4695 {cells}/uL (ref 1500–7800)
Neutrophils Relative %: 62.6 %
PLATELETS: 276 10*3/uL (ref 140–400)
RBC: 6.54 10*6/uL — AB (ref 4.20–5.80)
RDW: 13 % (ref 11.0–15.0)
Total Lymphocyte: 22.6 %
WBC mixed population: 1013 cells/uL — ABNORMAL HIGH (ref 200–950)
WBC: 7.5 10*3/uL (ref 3.8–10.8)

## 2017-03-27 LAB — COMPREHENSIVE METABOLIC PANEL
AG Ratio: 1.5 (calc) (ref 1.0–2.5)
ALBUMIN MSPROF: 4.4 g/dL (ref 3.6–5.1)
ALKALINE PHOSPHATASE (APISO): 69 U/L (ref 40–115)
ALT: 16 U/L (ref 9–46)
AST: 15 U/L (ref 10–40)
BUN: 14 mg/dL (ref 7–25)
CO2: 27 mmol/L (ref 20–32)
CREATININE: 1.3 mg/dL (ref 0.60–1.35)
Calcium: 9.4 mg/dL (ref 8.6–10.3)
Chloride: 104 mmol/L (ref 98–110)
GLUCOSE: 68 mg/dL (ref 65–99)
Globulin: 2.9 g/dL (calc) (ref 1.9–3.7)
POTASSIUM: 4.1 mmol/L (ref 3.5–5.3)
Sodium: 140 mmol/L (ref 135–146)
TOTAL PROTEIN: 7.3 g/dL (ref 6.1–8.1)
Total Bilirubin: 0.9 mg/dL (ref 0.2–1.2)

## 2017-03-27 LAB — T-HELPER CELL (CD4) - (RCID CLINIC ONLY)
CD4 T CELL ABS: 680 /uL (ref 400–2700)
CD4 T CELL HELPER: 38 % (ref 33–55)

## 2017-03-27 LAB — RPR: RPR: REACTIVE — AB

## 2017-03-27 LAB — FLUORESCENT TREPONEMAL AB(FTA)-IGG-BLD: Fluorescent Treponemal ABS: REACTIVE — AB

## 2017-03-27 LAB — RPR TITER

## 2017-03-30 LAB — URINE CYTOLOGY ANCILLARY ONLY
CHLAMYDIA, DNA PROBE: NEGATIVE
NEISSERIA GONORRHEA: NEGATIVE

## 2017-03-30 LAB — HIV-1 RNA QUANT-NO REFLEX-BLD
HIV 1 RNA Quant: <20 copies/mL
HIV-1 RNA QUANT, LOG: NOT DETECTED {Log_copies}/mL

## 2017-04-09 ENCOUNTER — Ambulatory Visit (INDEPENDENT_AMBULATORY_CARE_PROVIDER_SITE_OTHER): Payer: 59 | Admitting: Internal Medicine

## 2017-04-09 VITALS — BP 144/82 | HR 93 | Temp 99.0°F | Wt 231.0 lb

## 2017-04-09 DIAGNOSIS — Z23 Encounter for immunization: Secondary | ICD-10-CM

## 2017-04-09 DIAGNOSIS — B20 Human immunodeficiency virus [HIV] disease: Secondary | ICD-10-CM

## 2017-04-09 DIAGNOSIS — A539 Syphilis, unspecified: Secondary | ICD-10-CM

## 2017-04-09 MED ORDER — PENICILLIN G BENZATHINE 1200000 UNIT/2ML IM SUSP
2.4000 10*6.[IU] | Freq: Once | INTRAMUSCULAR | Status: AC
  Administered 2017-04-09: 2.4 10*6.[IU] via INTRAMUSCULAR

## 2017-04-09 NOTE — Progress Notes (Signed)
Patient ID: Ryan Martinez, male   DOB: 12-Feb-1991, 26 y.o.   MRN: [id number]  HPI Ryan Martinez is a 26yo M with hiv disease, well controlled on odefsey. He reports that he has only had one partner since April. He has hx of syphilis but rpr now reactive. No other symptoms  Otherwise in good health  Outpatient Encounter Prescriptions as of 04/09/2017  Medication Sig  . emtricitabine-rilpivir-tenofovir AF (ODEFSEY) 200-25-25 MG TABS tablet Take 1 tablet by mouth daily with breakfast.  . ibuprofen (ADVIL,MOTRIN) 800 MG tablet    No facility-administered encounter medications on file as of 04/09/2017.      Patient Active Problem List   Diagnosis Date Noted  . 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 History of syphilis 11/08/2015  . Pre-hypertension 09/15/2014  . History of MRSA infection 07/07/2011  . Human immunodeficiency virus (HIV) disease (HCC) 05/16/2011     Health Maintenance Due  Topic Date Due  . TETANUS/TDAP  10/18/2010  . INFLUENZA VACCINE  01/07/2017    Social History  Substance Use Topics  . Smoking status: Light Tobacco Smoker    Packs/day: 0.25    Years: 1.00    Types: Cigarettes  . Smokeless tobacco: Never Used     Comment: has set a quit date 06/09/13  . Alcohol use 0.0 oz/week    1 - 2 Shots of liquor per week   Review of Systems  Constitutional: Negative for fever, chills, diaphoresis, activity change, appetite change, fatigue and unexpected weight change.  HENT: Negative for congestion, sore throat, rhinorrhea, sneezing, trouble swallowing and sinus pressure.  Eyes: Negative for photophobia and visual disturbance.  Respiratory: Negative for cough, chest tightness, shortness of breath, wheezing and stridor.  Cardiovascular: Negative for chest pain, palpitations and leg swelling.  Gastrointestinal: Negative for nausea, vomiting, abdominal pain, diarrhea, constipation, blood in stool, abdominal distention and anal bleeding.  Genitourinary: Negative  for dysuria, hematuria, flank pain and difficulty urinating.  Musculoskeletal: Negative for myalgias, back pain, joint swelling, arthralgias and gait problem.  Skin: Negative for color change, pallor, rash and wound.  Neurological: Negative for dizziness, tremors, weakness and light-headedness.  Hematological: Negative for adenopathy. Does not bruise/bleed easily.  Psychiatric/Behavioral: Negative for behavioral problems, confusion, sleep disturbance, dysphoric mood, decreased concentration and agitation.    Physical Exam   BP (!) 144/82   Pulse 93   Temp 99 F (37.2 C) (Oral)   Wt 231 lb (104.8 kg)   BMI 32.58 kg/m   Physical Exam  Constitutional: He is oriented to person, place, and time. He appears well-developed and well-nourished. No distress.  HENT:  Mouth/Throat: Oropharynx is clear and moist. No oropharyngeal exudate.  Cardiovascular: Normal rate, regular rhythm and normal heart sounds. Exam reveals no gallop and no friction rub.  No murmur heard.  Pulmonary/Chest: Effort normal and breath sounds normal. No respiratory distress. He has no wheezes.  Abdominal: Soft. Bowel sounds are normal. He exhibits no distension. There is no tenderness.  Lymphadenopathy:  He has no cervical adenopathy.  Neurological: He is alert and oriented to person, place, and time.  Skin: Skin is warm and dry. No rash noted. No erythema.  Psychiatric: He has a normal mood and affect. His behavior is normal.    Lab Results  Component Value Date   CD4TCELL 38 03/26/2017   Lab Results  Component Value Date   CD4TABS 680 03/26/2017   CD4TABS 880 09/23/2016   CD4TABS 760 06/10/2016   Lab Results  Component  Value Date   HIV1RNAQUANT <20 NOT DETECTED 03/26/2017   Lab Results  Component Value Date   HEPBSAB NEG 05/13/2011   Lab Results  Component Value Date   LABRPR REACTIVE (A) 03/26/2017    CBC Lab Results  Component Value Date   WBC 7.5 03/26/2017   RBC 6.54 (H) 03/26/2017   HGB  15.1 03/26/2017   HCT 49.0 03/26/2017   PLT 276 03/26/2017   MCV 74.9 (L) 03/26/2017   MCH 23.1 (L) 03/26/2017   MCHC 30.8 (L) 03/26/2017   RDW 13.0 03/26/2017   LYMPHSABS 1,695 03/26/2017   MONOABS 670 06/10/2016   EOSABS 68 03/26/2017    BMET Lab Results  Component Value Date   NA 140 03/26/2017   K 4.1 03/26/2017   CL 104 03/26/2017   CO2 27 03/26/2017   GLUCOSE 68 03/26/2017   BUN 14 03/26/2017   CREATININE 1.30 03/26/2017   CALCIUM 9.4 03/26/2017   GFRNONAA >89 06/10/2016   GFRAA >89 06/10/2016      Assessment and Plan  Syphilis + = has hx of syphilis but previously non reactive now 1:2. Has had only 1 partner but unprotected in the last 6 months.  hiv disease = well controlled. Considering switch to biktarvy  Health maintenance = flu shot given

## 2017-04-12 ENCOUNTER — Other Ambulatory Visit: Payer: Self-pay | Admitting: Internal Medicine

## 2017-04-12 DIAGNOSIS — B20 Human immunodeficiency virus [HIV] disease: Secondary | ICD-10-CM

## 2017-07-03 ENCOUNTER — Other Ambulatory Visit (HOSPITAL_COMMUNITY)
Admission: RE | Admit: 2017-07-03 | Discharge: 2017-07-03 | Disposition: A | Discharge status: Home / Self Care | Payer: 59 | Source: Ambulatory Visit | Attending: Internal Medicine | Admitting: Internal Medicine

## 2017-07-03 ENCOUNTER — Other Ambulatory Visit: Payer: 59

## 2017-07-03 DIAGNOSIS — B20 Human immunodeficiency virus [HIV] disease: Secondary | ICD-10-CM | POA: Insufficient documentation

## 2017-07-03 DIAGNOSIS — Z113 Encounter for screening for infections with a predominantly sexual mode of transmission: Secondary | ICD-10-CM | POA: Diagnosis present

## 2017-07-03 DIAGNOSIS — Z79899 Other long term (current) drug therapy: Secondary | ICD-10-CM

## 2017-07-03 LAB — T-HELPER CELL (CD4) - (RCID CLINIC ONLY)
CD4 T CELL HELPER: 38 % (ref 33–55)
CD4 T Cell Abs: 820 /uL (ref 400–2700)

## 2017-07-06 LAB — CBC WITH DIFFERENTIAL/PLATELET
BASOS PCT: 0.6 %
Basophils Absolute: 32 cells/uL (ref 0–200)
EOS PCT: 2.1 %
Eosinophils Absolute: 111 cells/uL (ref 15–500)
HCT: 50 % (ref 38.5–50.0)
HEMOGLOBIN: 15.7 g/dL (ref 13.2–17.1)
Lymphs Abs: 1860 cells/uL (ref 850–3900)
MCH: 23.2 pg — ABNORMAL LOW (ref 27.0–33.0)
MCHC: 31.4 g/dL — AB (ref 32.0–36.0)
MCV: 73.9 fL — ABNORMAL LOW (ref 80.0–100.0)
MONOS PCT: 11.8 %
MPV: 9.1 fL (ref 7.5–12.5)
NEUTROS ABS: 2671 {cells}/uL (ref 1500–7800)
Neutrophils Relative %: 50.4 %
PLATELETS: 307 10*3/uL (ref 140–400)
RBC: 6.77 10*6/uL — ABNORMAL HIGH (ref 4.20–5.80)
RDW: 12.6 % (ref 11.0–15.0)
TOTAL LYMPHOCYTE: 35.1 %
WBC mixed population: 625 cells/uL (ref 200–950)
WBC: 5.3 10*3/uL (ref 3.8–10.8)

## 2017-07-06 LAB — LIPID PANEL
CHOL/HDL RATIO: 3.8 (calc) (ref <5.0)
CHOLESTEROL: 156 mg/dL (ref <200)
HDL: 41 mg/dL (ref >40)
LDL CHOLESTEROL (CALC): 96 mg/dL
Non-HDL Cholesterol (Calc): 115 mg/dL (calc) (ref <130)
TRIGLYCERIDES: 93 mg/dL (ref <150)

## 2017-07-06 LAB — COMPLETE METABOLIC PANEL WITH GFR
AG Ratio: 1.4 (calc) (ref 1.0–2.5)
ALKALINE PHOSPHATASE (APISO): 67 U/L (ref 40–115)
ALT: 19 U/L (ref 9–46)
AST: 21 U/L (ref 10–40)
Albumin: 4.8 g/dL (ref 3.6–5.1)
BUN: 12 mg/dL (ref 7–25)
CHLORIDE: 104 mmol/L (ref 98–110)
CO2: 26 mmol/L (ref 20–32)
CREATININE: 1.14 mg/dL (ref 0.60–1.35)
Calcium: 10 mg/dL (ref 8.6–10.3)
GFR, Est African American: 103 mL/min/{1.73_m2} (ref >=60)
GFR, Est Non African American: 89 mL/min/{1.73_m2} (ref >=60)
GLUCOSE: 90 mg/dL (ref 65–99)
Globulin: 3.5 g/dL (calc) (ref 1.9–3.7)
Potassium: 4.5 mmol/L (ref 3.5–5.3)
Sodium: 140 mmol/L (ref 135–146)
TOTAL PROTEIN: 8.3 g/dL — AB (ref 6.1–8.1)
Total Bilirubin: 0.8 mg/dL (ref 0.2–1.2)

## 2017-07-06 LAB — URINE CYTOLOGY ANCILLARY ONLY
CHLAMYDIA, DNA PROBE: NEGATIVE
Neisseria Gonorrhea: NEGATIVE

## 2017-07-06 LAB — RPR TITER: RPR Titer: 1:1 {titer} — ABNORMAL HIGH

## 2017-07-06 LAB — RPR: RPR: REACTIVE — AB

## 2017-07-06 LAB — FLUORESCENT TREPONEMAL AB(FTA)-IGG-BLD: FLUORESCENT TREPONEMAL ABS: REACTIVE — AB

## 2017-07-07 ENCOUNTER — Other Ambulatory Visit: Payer: 59

## 2017-07-07 LAB — HIV-1 RNA QUANT-NO REFLEX-BLD
HIV 1 RNA QUANT: NOT DETECTED {copies}/mL
HIV-1 RNA QUANT, LOG: NOT DETECTED {Log_copies}/mL

## 2017-07-20 ENCOUNTER — Ambulatory Visit: Payer: 59 | Admitting: Internal Medicine

## 2017-07-20 ENCOUNTER — Encounter: Payer: Self-pay | Admitting: Internal Medicine

## 2017-07-20 VITALS — BP 130/81 | HR 105 | Temp 97.8°F | Wt 223.0 lb

## 2017-07-20 DIAGNOSIS — Z79899 Other long term (current) drug therapy: Secondary | ICD-10-CM | POA: Diagnosis not present

## 2017-07-20 DIAGNOSIS — B20 Human immunodeficiency virus [HIV] disease: Secondary | ICD-10-CM

## 2017-07-20 DIAGNOSIS — Z8619 Personal history of other infectious and parasitic diseases: Secondary | ICD-10-CM | POA: Diagnosis not present

## 2017-07-20 NOTE — Progress Notes (Signed)
RFV: follow up for hiv disease  Patient ID: Ryan Martinez, male   DOB: 01-13-1991, 27 y.o.   MRN: [id number]  HPI 26yo M with well controlled hiv disease, CD 4 count of 820/VL<20, on odefsey. Hx of syphilis, RPR of 1:1.he reports doing well overall. No recent issues with adherence. Has been in good health since we last saw him.  Outpatient Encounter Medications as of 07/20/2017  Medication Sig  . ibuprofen (ADVIL,MOTRIN) 800 MG tablet   . ODEFSEY 200-25-25 MG TABS tablet TAKE 1 TABLET BY MOUTH DAILY WITH BREAKFAST.   No facility-administered encounter medications on file as of 07/20/2017.      Patient Active Problem List   Diagnosis Date Noted  . 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 History of syphilis 11/08/2015  . Pre-hypertension 09/15/2014  . History of MRSA infection 07/07/2011  . Human immunodeficiency virus (HIV) disease (HCC) 05/16/2011     Health Maintenance Due  Topic Date Due  . TETANUS/TDAP  10/18/2010     Review of Systems Review of Systems  Constitutional: Negative for fever, chills, diaphoresis, activity change, appetite change, fatigue and unexpected weight change.  HENT: Negative for congestion, sore throat, rhinorrhea, sneezing, trouble swallowing and sinus pressure.  Eyes: Negative for photophobia and visual disturbance.  Respiratory: Negative for cough, chest tightness, shortness of breath, wheezing and stridor.  Cardiovascular: Negative for chest pain, palpitations and leg swelling.  Gastrointestinal: Negative for nausea, vomiting, abdominal pain, diarrhea, constipation, blood in stool, abdominal distention and anal bleeding.  Genitourinary: Negative for dysuria, hematuria, flank pain and difficulty urinating.  Musculoskeletal: Negative for myalgias, back pain, joint swelling, arthralgias and gait problem.  Skin: Negative for color change, pallor, rash and wound.  Neurological: Negative for dizziness, tremors, weakness and light-headedness.   Hematological: Negative for adenopathy. Does not bruise/bleed easily.  Psychiatric/Behavioral: Negative for behavioral problems, confusion, sleep disturbance, dysphoric mood, decreased concentration and agitation.    Physical Exam   BP 130/81   Pulse (!) 105   Temp 97.8 F (36.6 C) (Oral)   Wt 223 lb (101.2 kg)   BMI 31.46 kg/m   Physical Exam  Constitutional: He is oriented to person, place, and time. He appears well-developed and well-nourished. No distress.  HENT:  Mouth/Throat: Oropharynx is clear and moist. No oropharyngeal exudate.  Cardiovascular: Normal rate, regular rhythm and normal heart sounds. Exam reveals no gallop and no friction rub.  No murmur heard.  Pulmonary/Chest: Effort normal and breath sounds normal. No respiratory distress. He has no wheezes.  Lymphadenopathy:  He has no cervical adenopathy.  Neurological: He is alert and oriented to person, place, and time.  Skin: Skin is warm and dry. No rash noted. No erythema.  Psychiatric: He has a normal mood and affect. His behavior is normal.    Lab Results  Component Value Date   CD4TCELL 38 07/03/2017   Lab Results  Component Value Date   CD4TABS 820 07/03/2017   CD4TABS 680 03/26/2017   CD4TABS 880 09/23/2016   Lab Results  Component Value Date   HIV1RNAQUANT <20 NOT DETECTED 07/03/2017   Lab Results  Component Value Date   HEPBSAB NEG 05/13/2011   Lab Results  Component Value Date   LABRPR REACTIVE (A) 07/03/2017    CBC Lab Results  Component Value Date   WBC 5.3 07/03/2017   RBC 6.77 (H) 07/03/2017   HGB 15.7 07/03/2017   HCT 50.0 07/03/2017   PLT 307 07/03/2017   MCV 73.9 (L) 07/03/2017  MCH 23.2 (L) 07/03/2017   MCHC 31.4 (L) 07/03/2017   RDW 12.6 07/03/2017   LYMPHSABS 1,860 07/03/2017   MONOABS 670 06/10/2016   EOSABS 111 07/03/2017    BMET Lab Results  Component Value Date   NA 140 07/03/2017   K 4.5 07/03/2017   CL 104 07/03/2017   CO2 26 07/03/2017   GLUCOSE 90  07/03/2017   BUN 12 07/03/2017   CREATININE 1.14 07/03/2017   CALCIUM 10.0 07/03/2017   GFRNONAA 89 07/03/2017   GFRAA 103 07/03/2017    Assessment and Plan HIV disease = well controlled. Continue on current regimen  Hx of syphilis = RPR stable at baseline, no new exposures reported  Health maintenance = uptodate with vaccines presently  Long-term drug monitoring = cr stable. No need to change hiv regimen  rtc in 6 months

## 2017-12-07 ENCOUNTER — Encounter: Payer: Self-pay | Admitting: Internal Medicine

## 2017-12-07 DIAGNOSIS — B20 Human immunodeficiency virus [HIV] disease: Secondary | ICD-10-CM

## 2017-12-07 MED ORDER — EMTRICITAB-RILPIVIR-TENOFOV AF 200-25-25 MG PO TABS: 1.0000 | ORAL_TABLET | Freq: Every day | ORAL | 2 refills | Status: DC

## 2017-12-07 NOTE — Telephone Encounter (Signed)
Medication sent to pharmacy  

## 2018-01-26 ENCOUNTER — Other Ambulatory Visit: Payer: 59

## 2018-01-26 ENCOUNTER — Other Ambulatory Visit: Payer: Self-pay | Admitting: *Deleted

## 2018-01-26 DIAGNOSIS — Z8619 Personal history of other infectious and parasitic diseases: Secondary | ICD-10-CM

## 2018-01-26 DIAGNOSIS — B20 Human immunodeficiency virus [HIV] disease: Secondary | ICD-10-CM

## 2018-01-29 ENCOUNTER — Other Ambulatory Visit: Payer: 59

## 2018-01-29 DIAGNOSIS — B20 Human immunodeficiency virus [HIV] disease: Secondary | ICD-10-CM

## 2018-01-29 DIAGNOSIS — Z8619 Personal history of other infectious and parasitic diseases: Secondary | ICD-10-CM

## 2018-01-29 LAB — T-HELPER CELL (CD4) - (RCID CLINIC ONLY)
CD4 % Helper T Cell: 42 % (ref 33–55)
CD4 T CELL ABS: 750 /uL (ref 400–2700)

## 2018-02-01 LAB — CBC WITH DIFFERENTIAL/PLATELET
BASOS PCT: 0.5 %
Basophils Absolute: 30 cells/uL (ref 0–200)
EOS ABS: 100 {cells}/uL (ref 15–500)
Eosinophils Relative: 1.7 %
HCT: 47.9 % (ref 38.5–50.0)
Hemoglobin: 14.8 g/dL (ref 13.2–17.1)
Lymphs Abs: 1729 cells/uL (ref 850–3900)
MCH: 23.2 pg — AB (ref 27.0–33.0)
MCHC: 30.9 g/dL — AB (ref 32.0–36.0)
MCV: 75.1 fL — AB (ref 80.0–100.0)
MONOS PCT: 8.6 %
MPV: 9.8 fL (ref 7.5–12.5)
NEUTROS PCT: 59.9 %
Neutro Abs: 3534 cells/uL (ref 1500–7800)
PLATELETS: 277 10*3/uL (ref 140–400)
RBC: 6.38 10*6/uL — ABNORMAL HIGH (ref 4.20–5.80)
RDW: 12.6 % (ref 11.0–15.0)
TOTAL LYMPHOCYTE: 29.3 %
WBC mixed population: 507 cells/uL (ref 200–950)
WBC: 5.9 10*3/uL (ref 3.8–10.8)

## 2018-02-01 LAB — COMPLETE METABOLIC PANEL WITH GFR
AG RATIO: 1.6 (calc) (ref 1.0–2.5)
ALT: 16 U/L (ref 9–46)
AST: 13 U/L (ref 10–40)
Albumin: 4.4 g/dL (ref 3.6–5.1)
Alkaline phosphatase (APISO): 62 U/L (ref 40–115)
BILIRUBIN TOTAL: 0.9 mg/dL (ref 0.2–1.2)
BUN: 10 mg/dL (ref 7–25)
CALCIUM: 9.2 mg/dL (ref 8.6–10.3)
CHLORIDE: 103 mmol/L (ref 98–110)
CO2: 29 mmol/L (ref 20–32)
Creat: 1.15 mg/dL (ref 0.60–1.35)
GFR, EST AFRICAN AMERICAN: 101 mL/min/{1.73_m2} (ref >=60)
GFR, EST NON AFRICAN AMERICAN: 87 mL/min/{1.73_m2} (ref >=60)
GLUCOSE: 121 mg/dL — AB (ref 65–99)
Globulin: 2.7 g/dL (calc) (ref 1.9–3.7)
POTASSIUM: 4.3 mmol/L (ref 3.5–5.3)
Sodium: 139 mmol/L (ref 135–146)
TOTAL PROTEIN: 7.1 g/dL (ref 6.1–8.1)

## 2018-02-01 LAB — HIV-1 RNA QUANT-NO REFLEX-BLD
HIV 1 RNA QUANT: NOT DETECTED {copies}/mL
HIV-1 RNA QUANT, LOG: NOT DETECTED {Log_copies}/mL

## 2018-02-01 LAB — RPR TITER

## 2018-02-01 LAB — RPR: RPR: REACTIVE — AB

## 2018-02-01 LAB — FLUORESCENT TREPONEMAL AB(FTA)-IGG-BLD: Fluorescent Treponemal ABS: REACTIVE — AB

## 2018-02-09 ENCOUNTER — Ambulatory Visit (INDEPENDENT_AMBULATORY_CARE_PROVIDER_SITE_OTHER): Payer: 59 | Admitting: Internal Medicine

## 2018-02-09 ENCOUNTER — Encounter: Payer: Self-pay | Admitting: Internal Medicine

## 2018-02-09 VITALS — BP 147/84 | HR 90 | Temp 99.1°F | Ht 70.0 in | Wt 226.0 lb

## 2018-02-09 DIAGNOSIS — Z23 Encounter for immunization: Secondary | ICD-10-CM | POA: Diagnosis not present

## 2018-02-09 DIAGNOSIS — Z79899 Other long term (current) drug therapy: Secondary | ICD-10-CM

## 2018-02-09 DIAGNOSIS — B20 Human immunodeficiency virus [HIV] disease: Secondary | ICD-10-CM | POA: Diagnosis not present

## 2018-02-09 DIAGNOSIS — Z8619 Personal history of other infectious and parasitic diseases: Secondary | ICD-10-CM | POA: Diagnosis not present

## 2018-02-09 MED ORDER — EMTRICITAB-RILPIVIR-TENOFOV AF 200-25-25 MG PO TABS: 1.0000 | ORAL_TABLET | Freq: Every day | ORAL | 11 refills | Status: DC

## 2018-02-09 NOTE — Progress Notes (Signed)
RFV: follow up for hiv disease  Patient ID: Ryan Martinez, male   DOB: 1990-12-03, 27 y.o.   MRN: [id number]  HPI [name] is a 27yo M with well controlled hiv disease, currently on odefsey, CD 4 count 750/VL<20. Since we last saw him he has purchased his own home. Doing well with his health. Not missing any doses of meds  No new sexual partners  Outpatient Encounter Medications as of 02/09/2018  Medication Sig  . emtricitabine-rilpivir-tenofovir AF (ODEFSEY) 200-25-25 MG TABS tablet Take 1 tablet by mouth daily with breakfast.  . [DISCONTINUED] ibuprofen (ADVIL,MOTRIN) 800 MG tablet    No facility-administered encounter medications on file as of 02/09/2018.      Patient Active Problem List   Diagnosis Date Noted  . 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 History of syphilis 11/08/2015  . Pre-hypertension 09/15/2014  . History of MRSA infection 07/07/2011  . Human immunodeficiency virus (HIV) disease (HCC) 05/16/2011     Health Maintenance Due  Topic Date Due  . TETANUS/TDAP  10/18/2010  . INFLUENZA VACCINE  01/07/2018    Social History   Tobacco Use  . Smoking status: Light Tobacco Smoker    Packs/day: 0.25    Years: 1.00    Pack years: 0.25    Types: Cigarettes  . Smokeless tobacco: Never Used  . Tobacco comment: has set a quit date 06/09/13  Substance Use Topics  . Alcohol use: Yes    Alcohol/week: 1.0 - 2.0 standard drinks    Types: 1 - 2 Shots of liquor per week    Comment: occ  . Drug use: No   Review of Systems Review of Systems  Constitutional: Negative for fever, chills, diaphoresis, activity change, appetite change, fatigue and unexpected weight change.  HENT: Negative for congestion, sore throat, rhinorrhea, sneezing, trouble swallowing and sinus pressure.  Eyes: Negative for photophobia and visual disturbance.  Respiratory: Negative for cough, chest tightness, shortness of breath, wheezing and stridor.  Cardiovascular: Negative for chest pain,  palpitations and leg swelling.  Gastrointestinal: Negative for nausea, vomiting, abdominal pain, diarrhea, constipation, blood in stool, abdominal distention and anal bleeding.  Genitourinary: Negative for dysuria, hematuria, flank pain and difficulty urinating.  Musculoskeletal: Negative for myalgias, back pain, joint swelling, arthralgias and gait problem.  Skin: Negative for color change, pallor, rash and wound.  Neurological: Negative for dizziness, tremors, weakness and light-headedness.  Hematological: Negative for adenopathy. Does not bruise/bleed easily.  Psychiatric/Behavioral: Negative for behavioral problems, confusion, sleep disturbance, dysphoric mood, decreased concentration and agitation.    Physical Exam   BP (!) 147/84   Pulse 90   Temp 99.1 F (37.3 C) (Oral)   Ht 5\' 10"  (1.778 m)   Wt 226 lb (102.5 kg)   BMI 32.43 kg/m   Physical Exam  Constitutional: He is oriented to person, place, and time. He appears well-developed and well-nourished. No distress.  HENT:  Mouth/Throat: Oropharynx is clear and moist. No oropharyngeal exudate.  Cardiovascular: Normal rate, regular rhythm and normal heart sounds. Exam reveals no gallop and no friction rub.  No murmur heard.  Pulmonary/Chest: Effort normal and breath sounds normal. No respiratory distress. He has no wheezes.  Abdominal: Soft. Bowel sounds are normal. He exhibits no distension. There is no tenderness.  Lymphadenopathy:  He has no cervical adenopathy.  Neurological: He is alert and oriented to person, place, and time.  Skin: Skin is warm and dry. No rash noted. No erythema.  Psychiatric: He has a normal mood and  affect. His behavior is normal.    Lab Results  Component Value Date   CD4TCELL 42 01/29/2018   Lab Results  Component Value Date   CD4TABS 750 01/29/2018   CD4TABS 820 07/03/2017   CD4TABS 680 03/26/2017   Lab Results  Component Value Date   HIV1RNAQUANT <20 NOT DETECTED 01/29/2018   Lab  Results  Component Value Date   HEPBSAB NEG 05/13/2011   Lab Results  Component Value Date   LABRPR REACTIVE (A) 01/29/2018    CBC Lab Results  Component Value Date   WBC 5.9 01/29/2018   RBC 6.38 (H) 01/29/2018   HGB 14.8 01/29/2018   HCT 47.9 01/29/2018   PLT 277 01/29/2018   MCV 75.1 (L) 01/29/2018   MCH 23.2 (L) 01/29/2018   MCHC 30.9 (L) 01/29/2018   RDW 12.6 01/29/2018   LYMPHSABS 1,729 01/29/2018   MONOABS 670 06/10/2016   EOSABS 100 01/29/2018    BMET Lab Results  Component Value Date   NA 139 01/29/2018   K 4.3 01/29/2018   CL 103 01/29/2018   CO2 29 01/29/2018   GLUCOSE 121 (H) 01/29/2018   BUN 10 01/29/2018   CREATININE 1.15 01/29/2018   CALCIUM 9.2 01/29/2018   GFRNONAA 87 01/29/2018   GFRAA 101 01/29/2018      Assessment and Plan  hiv disease =will give refill on odefsey. Well controlled  Long term medication management = cr is stable.  Hx of syphilis = titer of 1:2  No recent hx of unprotected sex  Health maintenance = wil give flu vaccine plus prevnar 13 today. Then at next appt will give pneumovax booster

## 2018-04-28 ENCOUNTER — Other Ambulatory Visit: Payer: Self-pay | Admitting: Behavioral Health

## 2018-04-28 ENCOUNTER — Other Ambulatory Visit: Payer: 59

## 2018-04-28 ENCOUNTER — Other Ambulatory Visit (HOSPITAL_COMMUNITY)
Admission: RE | Admit: 2018-04-28 | Discharge: 2018-04-28 | Disposition: A | Discharge status: Home / Self Care | Payer: 59 | Source: Ambulatory Visit | Attending: Internal Medicine | Admitting: Internal Medicine

## 2018-04-28 DIAGNOSIS — B20 Human immunodeficiency virus [HIV] disease: Secondary | ICD-10-CM

## 2018-04-28 DIAGNOSIS — Z113 Encounter for screening for infections with a predominantly sexual mode of transmission: Secondary | ICD-10-CM | POA: Insufficient documentation

## 2018-04-29 ENCOUNTER — Other Ambulatory Visit: Payer: 59

## 2018-04-29 LAB — T-HELPER CELL (CD4) - (RCID CLINIC ONLY)
CD4 T CELL HELPER: 38 % (ref 33–55)
CD4 T Cell Abs: 750 /uL (ref 400–2700)

## 2018-04-29 LAB — URINE CYTOLOGY ANCILLARY ONLY
Chlamydia: NEGATIVE
Neisseria Gonorrhea: NEGATIVE

## 2018-04-30 LAB — COMPREHENSIVE METABOLIC PANEL
AG Ratio: 1.4 (calc) (ref 1.0–2.5)
ALKALINE PHOSPHATASE (APISO): 77 U/L (ref 40–115)
ALT: 16 U/L (ref 9–46)
AST: 18 U/L (ref 10–40)
Albumin: 4.6 g/dL (ref 3.6–5.1)
BUN: 14 mg/dL (ref 7–25)
CO2: 27 mmol/L (ref 20–32)
CREATININE: 1.04 mg/dL (ref 0.60–1.35)
Calcium: 10 mg/dL (ref 8.6–10.3)
Chloride: 101 mmol/L (ref 98–110)
GLUCOSE: 121 mg/dL — AB (ref 65–99)
Globulin: 3.2 g/dL (calc) (ref 1.9–3.7)
Potassium: 4.5 mmol/L (ref 3.5–5.3)
Sodium: 138 mmol/L (ref 135–146)
Total Bilirubin: 1 mg/dL (ref 0.2–1.2)
Total Protein: 7.8 g/dL (ref 6.1–8.1)

## 2018-04-30 LAB — CBC WITH DIFFERENTIAL/PLATELET
BASOS PCT: 0.4 %
Basophils Absolute: 27 cells/uL (ref 0–200)
EOS ABS: 141 {cells}/uL (ref 15–500)
Eosinophils Relative: 2.1 %
HCT: 47.5 % (ref 38.5–50.0)
HEMOGLOBIN: 15.2 g/dL (ref 13.2–17.1)
Lymphs Abs: 1997 cells/uL (ref 850–3900)
MCH: 23.4 pg — AB (ref 27.0–33.0)
MCHC: 32 g/dL (ref 32.0–36.0)
MCV: 73.2 fL — AB (ref 80.0–100.0)
MONOS PCT: 9.4 %
MPV: 9.7 fL (ref 7.5–12.5)
NEUTROS ABS: 3906 {cells}/uL (ref 1500–7800)
Neutrophils Relative %: 58.3 %
PLATELETS: 307 10*3/uL (ref 140–400)
RBC: 6.49 10*6/uL — AB (ref 4.20–5.80)
RDW: 13.8 % (ref 11.0–15.0)
TOTAL LYMPHOCYTE: 29.8 %
WBC: 6.7 10*3/uL (ref 3.8–10.8)
WBCMIX: 630 {cells}/uL (ref 200–950)

## 2018-04-30 LAB — RPR TITER: RPR Titer: 1:2 {titer} — ABNORMAL HIGH

## 2018-04-30 LAB — RPR: RPR Ser Ql: REACTIVE — AB

## 2018-04-30 LAB — FLUORESCENT TREPONEMAL AB(FTA)-IGG-BLD: FLUORESCENT TREPONEMAL ABS: REACTIVE — AB

## 2018-04-30 LAB — HIV-1 RNA QUANT-NO REFLEX-BLD
HIV 1 RNA Quant: <20 copies/mL
HIV-1 RNA Quant, Log: <1.3 Log copies/mL

## 2018-05-19 ENCOUNTER — Encounter: Payer: 59 | Admitting: Internal Medicine

## 2018-06-14 ENCOUNTER — Encounter: Payer: Self-pay | Admitting: Internal Medicine

## 2018-06-14 ENCOUNTER — Ambulatory Visit: Payer: 59 | Admitting: Internal Medicine

## 2018-06-14 VITALS — BP 128/84 | HR 73 | Temp 98.9°F | Wt 220.0 lb

## 2018-06-14 DIAGNOSIS — Z79899 Other long term (current) drug therapy: Secondary | ICD-10-CM | POA: Diagnosis not present

## 2018-06-14 DIAGNOSIS — Z23 Encounter for immunization: Secondary | ICD-10-CM | POA: Diagnosis not present

## 2018-06-14 DIAGNOSIS — B20 Human immunodeficiency virus [HIV] disease: Secondary | ICD-10-CM | POA: Diagnosis not present

## 2018-06-14 NOTE — Progress Notes (Signed)
RFV: follow up for hiv disease  Patient ID: Ryan Martinez, male   DOB: 02/29/1991, 28 y.o.   MRN: [id number]  HPI Ryan Martinez is a 28yo M with hiv disease, CD 4 count of 750/VL<20 on odefsey.doing well since we last saw him. Continues to work at [employer]. Planning on going on cruise in April/may. Otherwise, he is doing well in his new home. Renting rooms through [employer] to help with payments. No new relationships  Outpatient Encounter Medications as of 06/14/2018  Medication Sig  . emtricitabine-rilpivir-tenofovir AF (ODEFSEY) 200-25-25 MG TABS tablet Take 1 tablet by mouth daily with breakfast.   No facility-administered encounter medications on file as of 06/14/2018.      Patient Active Problem List   Diagnosis Date Noted  . 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 History of syphilis 11/08/2015  . Pre-hypertension 09/15/2014  . History of MRSA infection 07/07/2011  . Human immunodeficiency virus (HIV) disease (HCC) 05/16/2011     Health Maintenance Due  Topic Date Due  . TETANUS/TDAP  10/18/2010     Review of Systems Review of Systems  Constitutional: Negative for fever, chills, diaphoresis, activity change, appetite change, fatigue and unexpected weight change.  HENT: Negative for congestion, sore throat, rhinorrhea, sneezing, trouble swallowing and sinus pressure.  Eyes: Negative for photophobia and visual disturbance.  Respiratory: Negative for cough, chest tightness, shortness of breath, wheezing and stridor.  Cardiovascular: Negative for chest pain, palpitations and leg swelling.  Gastrointestinal: Negative for nausea, vomiting, abdominal pain, diarrhea, constipation, blood in stool, abdominal distention and anal bleeding.  Genitourinary: Negative for dysuria, hematuria, flank pain and difficulty urinating.  Musculoskeletal: Negative for myalgias, back pain, joint swelling, arthralgias and gait problem.  Skin: Negative for color change, pallor, rash and wound.    Neurological: Negative for dizziness, tremors, weakness and light-headedness.  Hematological: Negative for adenopathy. Does not bruise/bleed easily.  Psychiatric/Behavioral: Negative for behavioral problems, confusion, sleep disturbance, dysphoric mood, decreased concentration and agitation.    Physical Exam   BP 128/84   Pulse 73   Temp 98.9 F (37.2 C)   Wt 220 lb (99.8 kg)   BMI 31.57 kg/m   Physical Exam  Constitutional: He is oriented to person, place, and time. He appears well-developed and well-nourished. No distress.  HENT:  Mouth/Throat: Oropharynx is clear and moist. No oropharyngeal exudate.  Cardiovascular: Normal rate, regular rhythm and normal heart sounds. Exam reveals no gallop and no friction rub.  No murmur heard.  Pulmonary/Chest: Effort normal and breath sounds normal. No respiratory distress. He has no wheezes.  Abdominal: Soft. Bowel sounds are normal. He exhibits no distension. There is no tenderness.  Lymphadenopathy:  He has no cervical adenopathy.  Neurological: He is alert and oriented to person, place, and time.  Skin: Skin is warm and dry. No rash noted. No erythema.  Psychiatric: He has a normal mood and affect. His behavior is normal.    Lab Results  Component Value Date   CD4TCELL 38 04/28/2018   Lab Results  Component Value Date   CD4TABS 750 04/28/2018   CD4TABS 750 01/29/2018   CD4TABS 820 07/03/2017   Lab Results  Component Value Date   HIV1RNAQUANT <20 NOT DETECTED 04/28/2018   Lab Results  Component Value Date   HEPBSAB NEG 05/13/2011   Lab Results  Component Value Date   LABRPR REACTIVE (A) 04/28/2018    CBC Lab Results  Component Value Date   WBC 6.7 04/28/2018   RBC 6.49 (  H) 04/28/2018   HGB 15.2 04/28/2018   HCT 47.5 04/28/2018   PLT 307 04/28/2018   MCV 73.2 (L) 04/28/2018   MCH 23.4 (L) 04/28/2018   MCHC 32.0 04/28/2018   RDW 13.8 04/28/2018   LYMPHSABS 1,997 04/28/2018   MONOABS 670 06/10/2016   EOSABS  141 04/28/2018    BMET Lab Results  Component Value Date   NA 138 04/28/2018   K 4.5 04/28/2018   CL 101 04/28/2018   CO2 27 04/28/2018   GLUCOSE 121 (H) 04/28/2018   BUN 14 04/28/2018   CREATININE 1.04 04/28/2018   CALCIUM 10.0 04/28/2018   GFRNONAA 87 01/29/2018   GFRAA 101 01/29/2018      Assessment and Plan  hiv disease= well controlled, continue on current regimen  Long term medicaiton management = cr is stable. No need to change his current regimen  Health maintenance = will give prevnar 13 today  sti at next visit

## 2018-11-02 DIAGNOSIS — B20 Human immunodeficiency virus [HIV] disease: Secondary | ICD-10-CM

## 2018-11-02 DIAGNOSIS — Z113 Encounter for screening for infections with a predominantly sexual mode of transmission: Secondary | ICD-10-CM

## 2018-11-02 MED ORDER — EMTRICITAB-RILPIVIR-TENOFOV AF 200-25-25 MG PO TABS: 1.0000 | ORAL_TABLET | Freq: Every day | ORAL | 2 refills | Status: DC

## 2018-12-02 ENCOUNTER — Other Ambulatory Visit: Payer: 59

## 2018-12-02 ENCOUNTER — Other Ambulatory Visit: Payer: Self-pay

## 2018-12-02 ENCOUNTER — Other Ambulatory Visit (HOSPITAL_COMMUNITY)
Admission: RE | Admit: 2018-12-02 | Discharge: 2018-12-02 | Disposition: A | Discharge status: Home / Self Care | Payer: 59 | Source: Ambulatory Visit | Attending: Internal Medicine | Admitting: Internal Medicine

## 2018-12-02 DIAGNOSIS — Z113 Encounter for screening for infections with a predominantly sexual mode of transmission: Secondary | ICD-10-CM | POA: Insufficient documentation

## 2018-12-02 DIAGNOSIS — B20 Human immunodeficiency virus [HIV] disease: Secondary | ICD-10-CM

## 2018-12-03 LAB — URINE CYTOLOGY ANCILLARY ONLY
Chlamydia: NEGATIVE
Neisseria Gonorrhea: NEGATIVE

## 2018-12-03 LAB — T-HELPER CELL (CD4) - (RCID CLINIC ONLY)
CD4 % Helper T Cell: 43 % (ref 33–65)
CD4 T Cell Abs: 629 /uL (ref 400–1790)

## 2018-12-08 LAB — CBC WITH DIFFERENTIAL/PLATELET
Absolute Monocytes: 781 cells/uL (ref 200–950)
Basophils Absolute: 31 cells/uL (ref 0–200)
Basophils Relative: 0.5 %
Eosinophils Absolute: 180 cells/uL (ref 15–500)
Eosinophils Relative: 2.9 %
HCT: 49.7 % (ref 38.5–50.0)
Hemoglobin: 15.6 g/dL (ref 13.2–17.1)
Lymphs Abs: 1711 cells/uL (ref 850–3900)
MCH: 23.8 pg — ABNORMAL LOW (ref 27.0–33.0)
MCHC: 31.4 g/dL — ABNORMAL LOW (ref 32.0–36.0)
MCV: 75.8 fL — ABNORMAL LOW (ref 80.0–100.0)
MPV: 9.4 fL (ref 7.5–12.5)
Monocytes Relative: 12.6 %
Neutro Abs: 3497 cells/uL (ref 1500–7800)
Neutrophils Relative %: 56.4 %
Platelets: 301 10*3/uL (ref 140–400)
RBC: 6.56 10*6/uL — ABNORMAL HIGH (ref 4.20–5.80)
RDW: 13.7 % (ref 11.0–15.0)
Total Lymphocyte: 27.6 %
WBC: 6.2 10*3/uL (ref 3.8–10.8)

## 2018-12-08 LAB — HIV-1 RNA QUANT-NO REFLEX-BLD
HIV 1 RNA Quant: <20 copies/mL — AB
HIV-1 RNA Quant, Log: <1.3 Log copies/mL — AB

## 2018-12-08 LAB — RPR: RPR Ser Ql: REACTIVE — AB

## 2018-12-08 LAB — COMPLETE METABOLIC PANEL WITH GFR
AG Ratio: 1.5 (calc) (ref 1.0–2.5)
ALT: 23 U/L (ref 9–46)
AST: 17 U/L (ref 10–40)
Albumin: 4.6 g/dL (ref 3.6–5.1)
Alkaline phosphatase (APISO): 69 U/L (ref 36–130)
BUN: 20 mg/dL (ref 7–25)
CO2: 27 mmol/L (ref 20–32)
Calcium: 9.8 mg/dL (ref 8.6–10.3)
Chloride: 101 mmol/L (ref 98–110)
Creat: 1.13 mg/dL (ref 0.60–1.35)
GFR, Est African American: 103 mL/min/{1.73_m2} (ref >=60)
GFR, Est Non African American: 89 mL/min/{1.73_m2} (ref >=60)
Globulin: 3.1 g/dL (calc) (ref 1.9–3.7)
Glucose, Bld: 86 mg/dL (ref 65–99)
Potassium: 4.9 mmol/L (ref 3.5–5.3)
Sodium: 138 mmol/L (ref 135–146)
Total Bilirubin: 1 mg/dL (ref 0.2–1.2)
Total Protein: 7.7 g/dL (ref 6.1–8.1)

## 2018-12-08 LAB — FLUORESCENT TREPONEMAL AB(FTA)-IGG-BLD: Fluorescent Treponemal ABS: REACTIVE — AB

## 2018-12-08 LAB — RPR TITER: RPR Titer: 1:2 {titer} — ABNORMAL HIGH

## 2018-12-13 ENCOUNTER — Other Ambulatory Visit: Payer: 59

## 2018-12-27 ENCOUNTER — Encounter: Payer: Self-pay | Admitting: Internal Medicine

## 2018-12-27 ENCOUNTER — Ambulatory Visit (INDEPENDENT_AMBULATORY_CARE_PROVIDER_SITE_OTHER): Payer: 59 | Admitting: Internal Medicine

## 2018-12-27 ENCOUNTER — Other Ambulatory Visit: Payer: Self-pay

## 2018-12-27 VITALS — BP 127/84 | HR 81 | Temp 98.6°F | Wt 229.0 lb

## 2018-12-27 DIAGNOSIS — B20 Human immunodeficiency virus [HIV] disease: Secondary | ICD-10-CM | POA: Diagnosis not present

## 2018-12-27 DIAGNOSIS — Z79899 Other long term (current) drug therapy: Secondary | ICD-10-CM

## 2018-12-27 DIAGNOSIS — Z8619 Personal history of other infectious and parasitic diseases: Secondary | ICD-10-CM | POA: Diagnosis not present

## 2018-12-27 NOTE — Progress Notes (Signed)
RFV: follow up for hiv disease  Patient ID: Ryan Martinez, male   DOB: 04-Jul-1990, 28 y.o.   MRN: [id number]  HPI 28yo M with hiv disease, well controlled. CD 4 count with 629/VL<20 on odefsey. Recalls maybe missing one dose due to pharmacy delay. Continues to do well. No recent illnesses. No covid contacts. No new partners or unprotected sex  Outpatient Encounter Medications as of 12/27/2018  Medication Sig  . emtricitabine-rilpivir-tenofovir AF (ODEFSEY) 200-25-25 MG TABS tablet Take 1 tablet by mouth daily with breakfast.   No facility-administered encounter medications on file as of 12/27/2018.      Patient Active Problem List   Diagnosis Date Noted  . 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 History of syphilis 11/08/2015  . Pre-hypertension 09/15/2014  . History of MRSA infection 07/07/2011  . Human immunodeficiency virus (HIV) disease ([facility]) 05/16/2011     Health Maintenance Due  Topic Date Due  . Samul Dada  10/18/2010    Social History   Tobacco Use  . Smoking status: Light Tobacco Smoker    Packs/day: 0.25    Years: 1.00    Pack years: 0.25    Types: Cigarettes  . Smokeless tobacco: Never Used  . Tobacco comment: has set a quit date 06/09/13  Substance Use Topics  . Alcohol use: Yes    Alcohol/week: 1.0 - 2.0 standard drinks    Types: 1 - 2 Shots of liquor per week    Comment: occ  . Drug use: No   Review of Systems Review of Systems  Constitutional: Negative for fever, chills, diaphoresis, activity change, appetite change, fatigue and unexpected weight change.  HENT: Negative for congestion, sore throat, rhinorrhea, sneezing, trouble swallowing and sinus pressure.  Eyes: Negative for photophobia and visual disturbance.  Respiratory: Negative for cough, chest tightness, shortness of breath, wheezing and stridor.  Cardiovascular: Negative for chest pain, palpitations and leg swelling.  Gastrointestinal: Negative for nausea, vomiting, abdominal  pain, diarrhea, constipation, blood in stool, abdominal distention and anal bleeding.  Genitourinary: Negative for dysuria, hematuria, flank pain and difficulty urinating.  Musculoskeletal: Negative for myalgias, back pain, joint swelling, arthralgias and gait problem.  Skin: Negative for color change, pallor, rash and wound.  Neurological: Negative for dizziness, tremors, weakness and light-headedness.  Hematological: Negative for adenopathy. Does not bruise/bleed easily.  Psychiatric/Behavioral: Negative for behavioral problems, confusion, sleep disturbance, dysphoric mood, decreased concentration and agitation.    Physical Exam   BP 127/84   Pulse 81   Temp 98.6 F (37 C) (Oral)   Wt 229 lb (103.9 kg)   BMI 32.86 kg/m   Physical Exam  Constitutional: He is oriented to person, place, and time. He appears well-developed and well-nourished. No distress.  HENT:  Mouth/Throat: Oropharynx is clear and moist. No oropharyngeal exudate.  Cardiovascular: Normal rate, regular rhythm and normal heart sounds. Exam reveals no gallop and no friction rub.  No murmur heard.  Pulmonary/Chest: Effort normal and breath sounds normal. No respiratory distress. He has no wheezes.   Lymphadenopathy:  He has no cervical adenopathy.  Neurological: He is alert and oriented to person, place, and time.  Skin: Skin is warm and dry. No rash noted. No erythema.  Psychiatric: He has a normal mood and affect. His behavior is normal.    Lab Results  Component Value Date   CD4TCELL 43 12/02/2018   Lab Results  Component Value Date   CD4TABS 629 12/02/2018   CD4TABS 750 04/28/2018   CD4TABS 750  01/29/2018   Lab Results  Component Value Date   HIV1RNAQUANT <20 DETECTED (A) 12/02/2018   Lab Results  Component Value Date   HEPBSAB NEG 05/13/2011   Lab Results  Component Value Date   LABRPR REACTIVE (A) 12/02/2018    CBC Lab Results  Component Value Date   WBC 6.2 12/02/2018   RBC 6.56 (H)  12/02/2018   HGB 15.6 12/02/2018   HCT 49.7 12/02/2018   PLT 301 12/02/2018   MCV 75.8 (L) 12/02/2018   MCH 23.8 (L) 12/02/2018   MCHC 31.4 (L) 12/02/2018   RDW 13.7 12/02/2018   LYMPHSABS 1,711 12/02/2018   MONOABS 670 06/10/2016   EOSABS 180 12/02/2018    BMET Lab Results  Component Value Date   NA 138 12/02/2018   K 4.9 12/02/2018   CL 101 12/02/2018   CO2 27 12/02/2018   GLUCOSE 86 12/02/2018   BUN 20 12/02/2018   CREATININE 1.13 12/02/2018   CALCIUM 9.8 12/02/2018   GFRNONAA 89 12/02/2018   GFRAA 103 12/02/2018      Assessment and Plan hiv disease = well controlled. Continue on odefsey  Long term medication management = cr is stable  rtc in 6 mo Health maintenance = Flu vaccine in the fall  Hx of syphilis = will check rpr at next visit

## 2019-01-07 ENCOUNTER — Other Ambulatory Visit: Payer: Self-pay | Admitting: Internal Medicine

## 2019-01-07 DIAGNOSIS — B20 Human immunodeficiency virus [HIV] disease: Secondary | ICD-10-CM

## 2019-03-22 ENCOUNTER — Other Ambulatory Visit: Payer: Self-pay

## 2019-03-22 ENCOUNTER — Ambulatory Visit (INDEPENDENT_AMBULATORY_CARE_PROVIDER_SITE_OTHER): Payer: 59

## 2019-03-22 DIAGNOSIS — Z23 Encounter for immunization: Secondary | ICD-10-CM | POA: Diagnosis not present

## 2019-04-07 ENCOUNTER — Other Ambulatory Visit: Payer: Self-pay

## 2019-04-07 ENCOUNTER — Other Ambulatory Visit: Payer: 59

## 2019-04-07 DIAGNOSIS — B20 Human immunodeficiency virus [HIV] disease: Secondary | ICD-10-CM

## 2019-04-08 LAB — COMPLETE METABOLIC PANEL WITH GFR
AG Ratio: 1.6 (calc) (ref 1.0–2.5)
ALT: 20 U/L (ref 9–46)
AST: 18 U/L (ref 10–40)
Albumin: 4.2 g/dL (ref 3.6–5.1)
Alkaline phosphatase (APISO): 54 U/L (ref 36–130)
BUN: 16 mg/dL (ref 7–25)
CO2: 24 mmol/L (ref 20–32)
Calcium: 9.2 mg/dL (ref 8.6–10.3)
Chloride: 106 mmol/L (ref 98–110)
Creat: 1.05 mg/dL (ref 0.60–1.35)
GFR, Est African American: 112 mL/min/{1.73_m2} (ref >=60)
GFR, Est Non African American: 97 mL/min/{1.73_m2} (ref >=60)
Globulin: 2.6 g/dL (calc) (ref 1.9–3.7)
Glucose, Bld: 135 mg/dL — ABNORMAL HIGH (ref 65–99)
Potassium: 4 mmol/L (ref 3.5–5.3)
Sodium: 139 mmol/L (ref 135–146)
Total Bilirubin: 1.3 mg/dL — ABNORMAL HIGH (ref 0.2–1.2)
Total Protein: 6.8 g/dL (ref 6.1–8.1)

## 2019-04-08 LAB — CBC WITH DIFFERENTIAL/PLATELET
Absolute Monocytes: 759 cells/uL (ref 200–950)
Basophils Absolute: 21 cells/uL (ref 0–200)
Basophils Relative: 0.4 %
Eosinophils Absolute: 120 cells/uL (ref 15–500)
Eosinophils Relative: 2.3 %
HCT: 45.5 % (ref 38.5–50.0)
Hemoglobin: 14.3 g/dL (ref 13.2–17.1)
Lymphs Abs: 1622 cells/uL (ref 850–3900)
MCH: 23.3 pg — ABNORMAL LOW (ref 27.0–33.0)
MCHC: 31.4 g/dL — ABNORMAL LOW (ref 32.0–36.0)
MCV: 74 fL — ABNORMAL LOW (ref 80.0–100.0)
MPV: 10.5 fL (ref 7.5–12.5)
Monocytes Relative: 14.6 %
Neutro Abs: 2678 cells/uL (ref 1500–7800)
Neutrophils Relative %: 51.5 %
Platelets: 261 10*3/uL (ref 140–400)
RBC: 6.15 10*6/uL — ABNORMAL HIGH (ref 4.20–5.80)
RDW: 12.7 % (ref 11.0–15.0)
Total Lymphocyte: 31.2 %
WBC: 5.2 10*3/uL (ref 3.8–10.8)

## 2019-04-28 ENCOUNTER — Other Ambulatory Visit: Payer: Self-pay | Admitting: *Deleted

## 2019-04-28 DIAGNOSIS — Z113 Encounter for screening for infections with a predominantly sexual mode of transmission: Secondary | ICD-10-CM

## 2019-05-03 ENCOUNTER — Other Ambulatory Visit: Payer: 59

## 2019-05-03 ENCOUNTER — Other Ambulatory Visit (HOSPITAL_COMMUNITY)
Admission: RE | Admit: 2019-05-03 | Discharge: 2019-05-03 | Disposition: A | Discharge status: Home / Self Care | Payer: 59 | Source: Ambulatory Visit | Attending: Internal Medicine | Admitting: Internal Medicine

## 2019-05-03 ENCOUNTER — Other Ambulatory Visit: Payer: Self-pay | Admitting: *Deleted

## 2019-05-03 ENCOUNTER — Other Ambulatory Visit: Payer: Self-pay

## 2019-05-03 DIAGNOSIS — Z113 Encounter for screening for infections with a predominantly sexual mode of transmission: Secondary | ICD-10-CM

## 2019-05-03 DIAGNOSIS — B20 Human immunodeficiency virus [HIV] disease: Secondary | ICD-10-CM

## 2019-05-04 LAB — CYTOLOGY, (ORAL, ANAL, URETHRAL) ANCILLARY ONLY
Chlamydia: NEGATIVE
Chlamydia: NEGATIVE
Comment: NEGATIVE
Comment: NEGATIVE
Comment: NORMAL
Comment: NORMAL
Neisseria Gonorrhea: NEGATIVE
Neisseria Gonorrhea: NEGATIVE

## 2019-05-04 LAB — T-HELPER CELL (CD4) - (RCID CLINIC ONLY)
CD4 % Helper T Cell: 43 % (ref 33–65)
CD4 T Cell Abs: 766 /uL (ref 400–1790)

## 2019-05-04 LAB — URINE CYTOLOGY ANCILLARY ONLY
Chlamydia: NEGATIVE
Comment: NEGATIVE
Comment: NORMAL
Neisseria Gonorrhea: NEGATIVE

## 2019-05-09 LAB — HIV-1 RNA QUANT-NO REFLEX-BLD
HIV 1 RNA Quant: 23 copies/mL — ABNORMAL HIGH
HIV-1 RNA Quant, Log: 1.36 Log copies/mL — ABNORMAL HIGH

## 2019-06-13 ENCOUNTER — Other Ambulatory Visit: Payer: Self-pay | Admitting: *Deleted

## 2019-06-13 DIAGNOSIS — B20 Human immunodeficiency virus [HIV] disease: Secondary | ICD-10-CM

## 2019-06-14 ENCOUNTER — Other Ambulatory Visit: Payer: Self-pay

## 2019-06-14 ENCOUNTER — Other Ambulatory Visit: Payer: 59

## 2019-06-14 DIAGNOSIS — B20 Human immunodeficiency virus [HIV] disease: Secondary | ICD-10-CM

## 2019-06-15 LAB — T-HELPER CELL (CD4) - (RCID CLINIC ONLY)
CD4 % Helper T Cell: 47 % (ref 33–65)
CD4 T Cell Abs: 957 /uL (ref 400–1790)

## 2019-06-17 LAB — HIV-1 RNA QUANT-NO REFLEX-BLD
HIV 1 RNA Quant: <20 copies/mL — AB
HIV-1 RNA Quant, Log: <1.3 Log copies/mL — AB

## 2019-07-01 ENCOUNTER — Telehealth: Payer: Self-pay

## 2019-07-01 NOTE — Telephone Encounter (Signed)
COVID-19 Pre-Screening Questions:07/01/19  Do you currently have a fever (>100 F), chills or unexplained body aches?NO   Are you currently experiencing new cough, shortness of breath, sore throat, runny nose? NO .  Have you recently travelled outside the state of [place] in the last 14 days?NO  .  Have you been in contact with someone that is currently pending confirmation of Covid19 testing or has been confirmed to have the Covid19 virus?  NO  **If the patient answers NO to ALL questions -  advise the patient to please call the clinic before coming to the office should any symptoms develop.     

## 2019-07-04 ENCOUNTER — Encounter: Payer: Self-pay | Admitting: Internal Medicine

## 2019-07-04 ENCOUNTER — Other Ambulatory Visit: Payer: Self-pay

## 2019-07-04 ENCOUNTER — Ambulatory Visit (INDEPENDENT_AMBULATORY_CARE_PROVIDER_SITE_OTHER): Payer: 59 | Admitting: Internal Medicine

## 2019-07-04 VITALS — BP 128/81 | HR 106 | Temp 98.7°F | Ht 70.0 in | Wt 220.0 lb

## 2019-07-04 DIAGNOSIS — Z8619 Personal history of other infectious and parasitic diseases: Secondary | ICD-10-CM

## 2019-07-04 DIAGNOSIS — Z79899 Other long term (current) drug therapy: Secondary | ICD-10-CM

## 2019-07-04 DIAGNOSIS — B20 Human immunodeficiency virus [HIV] disease: Secondary | ICD-10-CM

## 2019-07-04 NOTE — Progress Notes (Signed)
RFV: follow up for hiv disease  Patient ID: Ryan Martinez, male   DOB: [birth year], 29 y.o.   MRN: [id number]  HPI 29 yo M with well controlled hiv disease on odefsey. Doing well with taking meds regularly. No recent illnesses that he recalls since last visit.  No new partners since last visit  Outpatient Encounter Medications as of 07/04/2019  Medication Sig  . ODEFSEY 200-25-25 MG TABS tablet TAKE 1 TABLET BY MOUTH DAILY WITH BREAKFAST.   No facility-administered encounter medications on file as of 07/04/2019.     Patient Active Problem List   Diagnosis Date Noted  . 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 History of syphilis 11/08/2015  . Pre-hypertension 09/15/2014  . History of MRSA infection 07/07/2011  . Human immunodeficiency virus (HIV) disease (HCC) 05/16/2011     Health Maintenance Due  Topic Date Due  . TETANUS/TDAP  10/18/2010     Review of Systems Review of Systems  Constitutional: Negative for fever, chills, diaphoresis, activity change, appetite change, fatigue and unexpected weight change.  HENT: Negative for congestion, sore throat, rhinorrhea, sneezing, trouble swallowing and sinus pressure.  Eyes: Negative for photophobia and visual disturbance.  Respiratory: Negative for cough, chest tightness, shortness of breath, wheezing and stridor.  Cardiovascular: Negative for chest pain, palpitations and leg swelling.  Gastrointestinal: Negative for nausea, vomiting, abdominal pain, diarrhea, constipation, blood in stool, abdominal distention and anal bleeding.  Genitourinary: Negative for dysuria, hematuria, flank pain and difficulty urinating.  Musculoskeletal: Negative for myalgias, back pain, joint swelling, arthralgias and gait problem.  Skin: Negative for color change, pallor, rash and wound.  Neurological: Negative for dizziness, tremors, weakness and light-headedness.  Hematological: Negative for adenopathy. Does not bruise/bleed easily.    Psychiatric/Behavioral: Negative for behavioral problems, confusion, sleep disturbance, dysphoric mood, decreased concentration and agitation.    Physical Exam   BP 128/81   Pulse (!) 106   Temp 98.7 F (37.1 C) (Oral)   Ht 5\' 10"  (1.778 m)   Wt 220 lb (99.8 kg)   SpO2 99%   BMI 31.57 kg/m   Physical Exam  Constitutional: He is oriented to person, place, and time. He appears well-developed and well-nourished. No distress.  HENT:  Mouth/Throat: Oropharynx is clear and moist. No oropharyngeal exudate.  Cardiovascular: Normal rate, regular rhythm and normal heart sounds. Exam reveals no gallop and no friction rub.  No murmur heard.  Pulmonary/Chest: Effort normal and breath sounds normal. No respiratory distress. He has no wheezes.  Abdominal: Soft. Bowel sounds are normal. He exhibits no distension. There is no tenderness.  Lymphadenopathy:  He has no cervical adenopathy.  Neurological: He is alert and oriented to person, place, and time.  Skin: Skin is warm and dry. No rash noted. No erythema.  Psychiatric: He has a normal mood and affect. His behavior is normal.    Lab Results  Component Value Date   CD4TCELL 47 06/14/2019   Lab Results  Component Value Date   CD4TABS 957 06/14/2019   CD4TABS 766 05/03/2019   CD4TABS 629 12/02/2018   Lab Results  Component Value Date   HIV1RNAQUANT <20 DETECTED (A) 06/14/2019   Lab Results  Component Value Date   HEPBSAB NEG 05/13/2011   Lab Results  Component Value Date   LABRPR REACTIVE (A) 12/02/2018    CBC Lab Results  Component Value Date   WBC 5.2 04/07/2019   RBC 6.15 (H) 04/07/2019   HGB 14.3 04/07/2019   HCT 45.5 04/07/2019  PLT 261 04/07/2019   MCV 74.0 (L) 04/07/2019   MCH 23.3 (L) 04/07/2019   MCHC 31.4 (L) 04/07/2019   RDW 12.7 04/07/2019   LYMPHSABS 1,622 04/07/2019   MONOABS 670 06/10/2016   EOSABS 120 04/07/2019    BMET Lab Results  Component Value Date   NA 139 04/07/2019   K 4.0 04/07/2019    CL 106 04/07/2019   CO2 24 04/07/2019   GLUCOSE 135 (H) 04/07/2019   BUN 16 04/07/2019   CREATININE 1.05 04/07/2019   CALCIUM 9.2 04/07/2019   GFRNONAA 97 04/07/2019   GFRAA 112 04/07/2019      Assessment and Plan  hiv disease = well controlled, continue with odefsey  Long term medication = cr is stable  History of syphilis = plan on check rpr at next lab

## 2019-08-17 ENCOUNTER — Other Ambulatory Visit: Payer: Self-pay | Admitting: Internal Medicine

## 2019-08-17 DIAGNOSIS — B20 Human immunodeficiency virus [HIV] disease: Secondary | ICD-10-CM

## 2019-12-08 ENCOUNTER — Other Ambulatory Visit: Payer: Self-pay

## 2019-12-08 DIAGNOSIS — B20 Human immunodeficiency virus [HIV] disease: Secondary | ICD-10-CM

## 2019-12-15 ENCOUNTER — Other Ambulatory Visit: Payer: Self-pay

## 2019-12-15 ENCOUNTER — Other Ambulatory Visit: Payer: 59

## 2019-12-15 DIAGNOSIS — B20 Human immunodeficiency virus [HIV] disease: Secondary | ICD-10-CM

## 2019-12-16 LAB — T-HELPER CELL (CD4) - (RCID CLINIC ONLY)
CD4 % Helper T Cell: 43 % (ref 33–65)
CD4 T Cell Abs: 774 /uL (ref 400–1790)

## 2019-12-18 LAB — HIV-1 RNA QUANT-NO REFLEX-BLD
HIV 1 RNA Quant: 44 copies/mL — ABNORMAL HIGH
HIV-1 RNA Quant, Log: 1.64 Log copies/mL — ABNORMAL HIGH

## 2019-12-18 LAB — CBC WITH DIFFERENTIAL/PLATELET
Absolute Monocytes: 793 cells/uL (ref 200–950)
Basophils Absolute: 31 cells/uL (ref 0–200)
Basophils Relative: 0.5 %
Eosinophils Absolute: 122 cells/uL (ref 15–500)
Eosinophils Relative: 2 %
HCT: 50 % (ref 38.5–50.0)
Hemoglobin: 15.4 g/dL (ref 13.2–17.1)
Lymphs Abs: 1824 cells/uL (ref 850–3900)
MCH: 23.4 pg — ABNORMAL LOW (ref 27.0–33.0)
MCHC: 30.8 g/dL — ABNORMAL LOW (ref 32.0–36.0)
MCV: 76.1 fL — ABNORMAL LOW (ref 80.0–100.0)
MPV: 9.6 fL (ref 7.5–12.5)
Monocytes Relative: 13 %
Neutro Abs: 3331 cells/uL (ref 1500–7800)
Neutrophils Relative %: 54.6 %
Platelets: 275 10*3/uL (ref 140–400)
RBC: 6.57 10*6/uL — ABNORMAL HIGH (ref 4.20–5.80)
RDW: 13.1 % (ref 11.0–15.0)
Total Lymphocyte: 29.9 %
WBC: 6.1 10*3/uL (ref 3.8–10.8)

## 2019-12-18 LAB — COMPLETE METABOLIC PANEL WITH GFR
AG Ratio: 1.6 (calc) (ref 1.0–2.5)
ALT: 30 U/L (ref 9–46)
AST: 22 U/L (ref 10–40)
Albumin: 4.7 g/dL (ref 3.6–5.1)
Alkaline phosphatase (APISO): 68 U/L (ref 36–130)
BUN: 19 mg/dL (ref 7–25)
CO2: 29 mmol/L (ref 20–32)
Calcium: 9.8 mg/dL (ref 8.6–10.3)
Chloride: 106 mmol/L (ref 98–110)
Creat: 1.2 mg/dL (ref 0.60–1.35)
GFR, Est African American: 95 mL/min/{1.73_m2} (ref >=60)
GFR, Est Non African American: 82 mL/min/{1.73_m2} (ref >=60)
Globulin: 3 g/dL (calc) (ref 1.9–3.7)
Glucose, Bld: 56 mg/dL — ABNORMAL LOW (ref 65–99)
Potassium: 4.4 mmol/L (ref 3.5–5.3)
Sodium: 139 mmol/L (ref 135–146)
Total Bilirubin: 1.1 mg/dL (ref 0.2–1.2)
Total Protein: 7.7 g/dL (ref 6.1–8.1)

## 2020-01-02 ENCOUNTER — Telehealth: Payer: Self-pay

## 2020-01-02 ENCOUNTER — Other Ambulatory Visit: Payer: Self-pay

## 2020-01-02 ENCOUNTER — Encounter: Payer: Self-pay | Admitting: Internal Medicine

## 2020-01-02 ENCOUNTER — Ambulatory Visit (INDEPENDENT_AMBULATORY_CARE_PROVIDER_SITE_OTHER): Payer: 59 | Admitting: Internal Medicine

## 2020-01-02 VITALS — HR 72 | Wt 239.8 lb

## 2020-01-02 DIAGNOSIS — B20 Human immunodeficiency virus [HIV] disease: Secondary | ICD-10-CM | POA: Diagnosis not present

## 2020-01-02 DIAGNOSIS — S8990XA Unspecified injury of unspecified lower leg, initial encounter: Secondary | ICD-10-CM

## 2020-01-02 DIAGNOSIS — Z79899 Other long term (current) drug therapy: Secondary | ICD-10-CM

## 2020-01-02 DIAGNOSIS — S81811A Laceration without foreign body, right lower leg, initial encounter: Secondary | ICD-10-CM | POA: Diagnosis not present

## 2020-01-02 NOTE — Telephone Encounter (Signed)
Patient called office today to follow up on letter that was written by MD today. Patient states employer has additional forms that must be filled out. Patient is needing forms completed today or tomorrow. Will fill out what he can and drop them off with the front desk.  Informed patient that provider is working in clinic this afternoon and probably wont be able to complete until later day this week. Would like call once forms are completed.  Ryan Martinez, [place]

## 2020-01-02 NOTE — Progress Notes (Signed)
Patient ID: Ryan Martinez, male   DOB: 02-14-1991, 29 y.o.   MRN: [id number]  HPI Zahmir is a 29yo M with well controlled hiv disease.  A few doses that were late, only miss one dose.overall doing well. He did sustain large tissue injury tear on his anterior tibial region of right leg yesterday but did not go to the emergency room. Did bleed profusely and did not go to work (as he works at [employer])  Outpatient Encounter Medications as of 01/02/2020  Medication Sig  . ODEFSEY 200-25-25 MG TABS tablet TAKE 1 TABLET BY MOUTH DAILY WITH BREAKFAST.   No facility-administered encounter medications on file as of 01/02/2020.     Patient Active Problem List   Diagnosis Date Noted  . 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 History of syphilis 11/08/2015  . Pre-hypertension 09/15/2014  . History of MRSA infection 07/07/2011  . Human immunodeficiency virus (HIV) disease (HCC) 05/16/2011     Health Maintenance Due  Topic Date Due  . COVID-19 Vaccine (1) Never done  . TETANUS/TDAP  Never done     Review of Systems Review of Systems  Constitutional: Negative for fever, chills, diaphoresis, activity change, appetite change, fatigue and unexpected weight change.  HENT: Negative for congestion, sore throat, rhinorrhea, sneezing, trouble swallowing and sinus pressure.  Eyes: Negative for photophobia and visual disturbance.  Respiratory: Negative for cough, chest tightness, shortness of breath, wheezing and stridor.  Cardiovascular: Negative for chest pain, palpitations and leg swelling.  Gastrointestinal: Negative for nausea, vomiting, abdominal pain, diarrhea, constipation, blood in stool, abdominal distention and anal bleeding.  Genitourinary: Negative for dysuria, hematuria, flank pain and difficulty urinating.  Musculoskeletal: Negative for myalgias, back pain, joint swelling, arthralgias and gait problem.  Skin: Negative for color change, pallor, rash and wound.    Neurological: Negative for dizziness, tremors, weakness and light-headedness.  Hematological: Negative for adenopathy. Does not bruise/bleed easily.  Psychiatric/Behavioral: Negative for behavioral problems, confusion, sleep disturbance, dysphoric mood, decreased concentration and agitation.    Physical Exam   Pulse 72   Wt (!) 239 lb 12.8 oz (108.8 kg)   SpO2 100%   BMI 34.41 kg/m   Physical Exam  Constitutional: He is oriented to person, place, and time. He appears well-developed and well-nourished. No distress.  HENT:  Mouth/Throat: Oropharynx is clear and moist. No oropharyngeal exudate.  Cardiovascular: Normal rate, regular rhythm and normal heart sounds. Exam reveals no gallop and no friction rub.  No murmur heard.  Pulmonary/Chest: Effort normal and breath sounds normal. No respiratory distress. He has no wheezes.  Abdominal: Soft. Bowel sounds are normal. He exhibits no distension. There is no tenderness.  Lymphadenopathy:  He has no cervical adenopathy.  Neurological: He is alert and oriented to person, place, and time.  Skin: Skin is warm and dry. No rash noted. No erythema. Large skin tear to anterior tibia. No erythema no purulent drainage Psychiatric: He has a normal mood and affect. His behavior is normal.    Lab Results  Component Value Date   CD4TCELL 43 12/15/2019   Lab Results  Component Value Date   CD4TABS 774 12/15/2019   CD4TABS 957 06/14/2019   CD4TABS 766 05/03/2019   Lab Results  Component Value Date   HIV1RNAQUANT 44 (H) 12/15/2019   Lab Results  Component Value Date   HEPBSAB NEG 05/13/2011   Lab Results  Component Value Date   LABRPR REACTIVE (A) 12/02/2018    CBC Lab Results  Component Value Date   WBC 6.1 12/15/2019   RBC 6.57 (H) 12/15/2019   HGB 15.4 12/15/2019   HCT 50.0 12/15/2019   PLT 275 12/15/2019   MCV 76.1 (L) 12/15/2019   MCH 23.4 (L) 12/15/2019   MCHC 30.8 (L) 12/15/2019   RDW 13.1 12/15/2019   LYMPHSABS 1,824  12/15/2019   MONOABS 670 06/10/2016   EOSABS 122 12/15/2019    BMET Lab Results  Component Value Date   NA 139 12/15/2019   K 4.4 12/15/2019   CL 106 12/15/2019   CO2 29 12/15/2019   GLUCOSE 56 (L) 12/15/2019   BUN 19 12/15/2019   CREATININE 1.20 12/15/2019   CALCIUM 9.8 12/15/2019   GFRNONAA 82 12/15/2019   GFRAA 95 12/15/2019      Assessment and Plan  Large tissue injury = need to wrap area to stop oozing, and also will give directions on how to do wound care. Will give lette for work Minimal standing- desk/ or riding to delivery, minimize walking over the next week  Reassess in a week  Asked him to call in covid-19  dates received the vaccine  hiv disease = continue on odefsey  Long term medication management = will check cr

## 2020-01-02 NOTE — Patient Instructions (Signed)
Please wash your wound with soap and water,  Call if you see surrounding redness and pus draining from your wound  Please send photos to [phone] every 3 days to look at progress

## 2020-01-03 NOTE — Progress Notes (Signed)
Request for temporary light duty form signed by Dr. Drue Second. Patient will come by the office to pick up today. Copy make for scanning.  Ryan Martinez'

## 2020-01-10 ENCOUNTER — Encounter: Payer: Self-pay | Admitting: *Deleted

## 2020-01-16 NOTE — Telephone Encounter (Signed)
Original FMLA paperwork completed and mailed in envelope provided by patient.  Copy made for his records and mailed to his address (confirmed).  Copy of FMLA, as well as copies of previous Request for Temporary Light Duty forms - all placed at front desk for scanning into his chart.  Andree Coss, RN

## 2020-02-09 NOTE — Telephone Encounter (Signed)
Patient sent updated picture of leg injury for Dr. Drue Second to review. Patient is requesting an updated letter to return to work without restrictions on 02/11/2020. Patient sustained injury on 01/01/20 returned to work on light duty 01/10/20 and now states he is ready to return to work without restrictions. Routing message to Dr. Drue Second. Valarie Cones

## 2020-03-12 ENCOUNTER — Other Ambulatory Visit: Payer: Self-pay

## 2020-03-12 DIAGNOSIS — Z79899 Other long term (current) drug therapy: Secondary | ICD-10-CM

## 2020-03-12 DIAGNOSIS — B20 Human immunodeficiency virus [HIV] disease: Secondary | ICD-10-CM

## 2020-03-12 DIAGNOSIS — Z113 Encounter for screening for infections with a predominantly sexual mode of transmission: Secondary | ICD-10-CM

## 2020-03-16 ENCOUNTER — Other Ambulatory Visit: Payer: Self-pay

## 2020-03-16 ENCOUNTER — Other Ambulatory Visit (HOSPITAL_COMMUNITY)
Admission: RE | Admit: 2020-03-16 | Discharge: 2020-03-16 | Disposition: A | Discharge status: Home / Self Care | Payer: 59 | Source: Ambulatory Visit | Attending: Internal Medicine | Admitting: Internal Medicine

## 2020-03-16 ENCOUNTER — Other Ambulatory Visit: Payer: 59

## 2020-03-16 DIAGNOSIS — Z113 Encounter for screening for infections with a predominantly sexual mode of transmission: Secondary | ICD-10-CM | POA: Insufficient documentation

## 2020-03-16 DIAGNOSIS — Z79899 Other long term (current) drug therapy: Secondary | ICD-10-CM

## 2020-03-16 DIAGNOSIS — B20 Human immunodeficiency virus [HIV] disease: Secondary | ICD-10-CM | POA: Diagnosis not present

## 2020-03-16 LAB — T-HELPER CELL (CD4) - (RCID CLINIC ONLY)
CD4 % Helper T Cell: 47 % (ref 33–65)
CD4 T Cell Abs: 703 /uL (ref 400–1790)

## 2020-03-18 LAB — URINE CYTOLOGY ANCILLARY ONLY
Chlamydia: NEGATIVE
Comment: NEGATIVE
Comment: NORMAL
Neisseria Gonorrhea: NEGATIVE

## 2020-03-19 LAB — LIPID PANEL
Cholesterol: 176 mg/dL (ref <200)
HDL: 43 mg/dL (ref >=40)
LDL Cholesterol (Calc): 103 mg/dL (calc) — ABNORMAL HIGH
Non-HDL Cholesterol (Calc): 133 mg/dL (calc) — ABNORMAL HIGH (ref <130)
Total CHOL/HDL Ratio: 4.1 (calc) (ref <5.0)
Triglycerides: 180 mg/dL — ABNORMAL HIGH (ref <150)

## 2020-03-19 LAB — CBC WITH DIFFERENTIAL/PLATELET
Absolute Monocytes: 594 cells/uL (ref 200–950)
Basophils Absolute: 28 cells/uL (ref 0–200)
Basophils Relative: 0.5 %
Eosinophils Absolute: 112 cells/uL (ref 15–500)
Eosinophils Relative: 2 %
HCT: 46.7 % (ref 38.5–50.0)
Hemoglobin: 14.8 g/dL (ref 13.2–17.1)
Lymphs Abs: 1613 cells/uL (ref 850–3900)
MCH: 23.6 pg — ABNORMAL LOW (ref 27.0–33.0)
MCHC: 31.7 g/dL — ABNORMAL LOW (ref 32.0–36.0)
MCV: 74.6 fL — ABNORMAL LOW (ref 80.0–100.0)
MPV: 9.4 fL (ref 7.5–12.5)
Monocytes Relative: 10.6 %
Neutro Abs: 3254 cells/uL (ref 1500–7800)
Neutrophils Relative %: 58.1 %
Platelets: 245 10*3/uL (ref 140–400)
RBC: 6.26 10*6/uL — ABNORMAL HIGH (ref 4.20–5.80)
RDW: 13.1 % (ref 11.0–15.0)
Total Lymphocyte: 28.8 %
WBC: 5.6 10*3/uL (ref 3.8–10.8)

## 2020-03-19 LAB — COMPLETE METABOLIC PANEL WITH GFR
AG Ratio: 1.5 (calc) (ref 1.0–2.5)
ALT: 25 U/L (ref 9–46)
AST: 17 U/L (ref 10–40)
Albumin: 4.1 g/dL (ref 3.6–5.1)
Alkaline phosphatase (APISO): 59 U/L (ref 36–130)
BUN: 13 mg/dL (ref 7–25)
CO2: 28 mmol/L (ref 20–32)
Calcium: 9.2 mg/dL (ref 8.6–10.3)
Chloride: 104 mmol/L (ref 98–110)
Creat: 1 mg/dL (ref 0.60–1.35)
GFR, Est African American: 118 mL/min/{1.73_m2} (ref >=60)
GFR, Est Non African American: 102 mL/min/{1.73_m2} (ref >=60)
Globulin: 2.7 g/dL (calc) (ref 1.9–3.7)
Glucose, Bld: 92 mg/dL (ref 65–99)
Potassium: 4.5 mmol/L (ref 3.5–5.3)
Sodium: 138 mmol/L (ref 135–146)
Total Bilirubin: 1 mg/dL (ref 0.2–1.2)
Total Protein: 6.8 g/dL (ref 6.1–8.1)

## 2020-03-19 LAB — HIV-1 RNA QUANT-NO REFLEX-BLD
HIV 1 RNA Quant: 25 Copies/mL — ABNORMAL HIGH
HIV-1 RNA Quant, Log: 1.4 Log cps/mL — ABNORMAL HIGH

## 2020-03-19 LAB — RPR TITER: RPR Titer: 1:1 {titer} — ABNORMAL HIGH

## 2020-03-19 LAB — RPR: RPR Ser Ql: REACTIVE — AB

## 2020-03-19 LAB — FLUORESCENT TREPONEMAL AB(FTA)-IGG-BLD: Fluorescent Treponemal ABS: REACTIVE — AB

## 2020-03-23 ENCOUNTER — Other Ambulatory Visit: Payer: Self-pay | Admitting: Internal Medicine

## 2020-03-23 DIAGNOSIS — B20 Human immunodeficiency virus [HIV] disease: Secondary | ICD-10-CM

## 2020-03-26 ENCOUNTER — Other Ambulatory Visit: Payer: Self-pay

## 2020-03-26 ENCOUNTER — Encounter: Payer: Self-pay | Admitting: Internal Medicine

## 2020-03-26 ENCOUNTER — Telehealth (INDEPENDENT_AMBULATORY_CARE_PROVIDER_SITE_OTHER): Payer: 59 | Admitting: Internal Medicine

## 2020-03-26 DIAGNOSIS — B20 Human immunodeficiency virus [HIV] disease: Secondary | ICD-10-CM | POA: Diagnosis not present

## 2020-03-26 DIAGNOSIS — Z79899 Other long term (current) drug therapy: Secondary | ICD-10-CM | POA: Diagnosis not present

## 2020-03-26 DIAGNOSIS — Z23 Encounter for immunization: Secondary | ICD-10-CM | POA: Diagnosis not present

## 2020-03-26 NOTE — Progress Notes (Signed)
  Virtual telephone visit Patient identified by 2 identifiers-  Patient = at work Provider = in clinic  Patient ID: Ryan Martinez, male   DOB: 1990/10/08, 29 y.o.   MRN: [id number]  HPI  Ryan Martinez is a 29yo M with well controlled hiv disease currently taking odefsey, without difficulty. He denies missing doses. When we last saw him this summer, he suffered larger skin tear to lower leg that his now healed. He has been back to work full time.  No recent covid exposure No recent illnesses    Outpatient Encounter Medications as of 03/26/2020  Medication Sig  . ODEFSEY 200-25-25 MG TABS tablet TAKE 1 TABLET BY MOUTH DAILY WITH BREAKFAST.   No facility-administered encounter medications on file as of 03/26/2020.     Patient Active Problem List   Diagnosis Date Noted  . 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 History of syphilis 11/08/2015  . Pre-hypertension 09/15/2014  . History of MRSA infection 07/07/2011  . Human immunodeficiency virus (HIV) disease (HCC) 05/16/2011     Health Maintenance Due  Topic Date Due  . TETANUS/TDAP  Never done  . INFLUENZA VACCINE  01/08/2020     Review of Systems 12 point ros is negative Physical Exam  Did not examine patient  Lab Results  Component Value Date   CD4TCELL 47 03/16/2020   Lab Results  Component Value Date   CD4TABS 703 03/16/2020   CD4TABS 774 12/15/2019   CD4TABS 957 06/14/2019   Lab Results  Component Value Date   HIV1RNAQUANT 25 (H) 03/16/2020   Lab Results  Component Value Date   HEPBSAB NEG 05/13/2011   Lab Results  Component Value Date   LABRPR REACTIVE (A) 03/16/2020    CBC Lab Results  Component Value Date   WBC 5.6 03/16/2020   RBC 6.26 (H) 03/16/2020   HGB 14.8 03/16/2020   HCT 46.7 03/16/2020   PLT 245 03/16/2020   MCV 74.6 (L) 03/16/2020   MCH 23.6 (L) 03/16/2020   MCHC 31.7 (L) 03/16/2020   RDW 13.1 03/16/2020   LYMPHSABS 1,613 03/16/2020   MONOABS 670 06/10/2016   EOSABS 112  03/16/2020    BMET Lab Results  Component Value Date   NA 138 03/16/2020   K 4.5 03/16/2020   CL 104 03/16/2020   CO2 28 03/16/2020   GLUCOSE 92 03/16/2020   BUN 13 03/16/2020   CREATININE 1.00 03/16/2020   CALCIUM 9.2 03/16/2020   GFRNONAA 102 03/16/2020   GFRAA 118 03/16/2020      Assessment and Plan  hiv disease= well controlled based on recent labs  Long term medication management = cr is stable  Leg wound = healed, resolved  Health maintenance =Will give pfizer and flu this Friday 11:15 am  rtc in 6 months

## 2020-04-06 ENCOUNTER — Ambulatory Visit: Payer: 59

## 2020-04-06 NOTE — Telephone Encounter (Signed)
Added, Thanks

## 2020-04-27 ENCOUNTER — Ambulatory Visit (INDEPENDENT_AMBULATORY_CARE_PROVIDER_SITE_OTHER): Payer: 59

## 2020-04-27 ENCOUNTER — Other Ambulatory Visit: Payer: Self-pay

## 2020-04-27 DIAGNOSIS — Z23 Encounter for immunization: Secondary | ICD-10-CM

## 2020-04-27 NOTE — Progress Notes (Signed)
   Covid-19 Vaccination Clinic  Name:  KIOWA HOLLAR    MRN: [id number] DOB: 1990-10-27  04/27/2020  Mr. Medine was observed post Covid-19 immunization for 15 minutes without incident. He was provided with Vaccine Information Sheet and instruction to access the V-Safe system.   Mr. Woolum was instructed to call 911 with any severe reactions post vaccine: Marland Kitchen Difficulty breathing  . Swelling of face and throat  . A fast heartbeat  . A bad rash all over body  . Dizziness and weakness   Immunizations Administered    Name Date Dose VIS Date Route   Pfizer COVID-19 Vaccine 04/27/2020 11:19 AM 0.3 mL 03/28/2020 Intramuscular   Manufacturer: [employer], [employer]   Lot: [id number]   NDC: [address]-7     Andree Coss, RN

## 2020-05-24 ENCOUNTER — Other Ambulatory Visit: Payer: Self-pay | Admitting: Internal Medicine

## 2020-05-24 DIAGNOSIS — B20 Human immunodeficiency virus [HIV] disease: Secondary | ICD-10-CM

## 2020-08-16 ENCOUNTER — Other Ambulatory Visit: Payer: Self-pay

## 2020-08-16 DIAGNOSIS — B20 Human immunodeficiency virus [HIV] disease: Secondary | ICD-10-CM

## 2020-08-21 ENCOUNTER — Other Ambulatory Visit: Payer: 59

## 2020-08-21 ENCOUNTER — Other Ambulatory Visit: Payer: Self-pay

## 2020-08-21 DIAGNOSIS — B20 Human immunodeficiency virus [HIV] disease: Secondary | ICD-10-CM

## 2020-08-22 LAB — T-HELPER CELL (CD4) - (RCID CLINIC ONLY)
CD4 % Helper T Cell: 48 % (ref 33–65)
CD4 T Cell Abs: 832 /uL (ref 400–1790)

## 2020-08-25 LAB — HIV-1 RNA QUANT-NO REFLEX-BLD
HIV 1 RNA Quant: <20 Copies/mL — ABNORMAL HIGH
HIV-1 RNA Quant, Log: <1.3 Log cps/mL — ABNORMAL HIGH

## 2020-08-25 LAB — COMPLETE METABOLIC PANEL WITH GFR
AG Ratio: 1.7 (calc) (ref 1.0–2.5)
ALT: 20 U/L (ref 9–46)
AST: 22 U/L (ref 10–40)
Albumin: 4.4 g/dL (ref 3.6–5.1)
Alkaline phosphatase (APISO): 73 U/L (ref 36–130)
BUN: 13 mg/dL (ref 7–25)
CO2: 28 mmol/L (ref 20–32)
Calcium: 9.5 mg/dL (ref 8.6–10.3)
Chloride: 106 mmol/L (ref 98–110)
Creat: 1.09 mg/dL (ref 0.60–1.35)
GFR, Est African American: 106 mL/min/{1.73_m2} (ref >=60)
GFR, Est Non African American: 92 mL/min/{1.73_m2} (ref >=60)
Globulin: 2.6 g/dL (calc) (ref 1.9–3.7)
Glucose, Bld: 89 mg/dL (ref 65–99)
Potassium: 4.4 mmol/L (ref 3.5–5.3)
Sodium: 141 mmol/L (ref 135–146)
Total Bilirubin: 1.2 mg/dL (ref 0.2–1.2)
Total Protein: 7 g/dL (ref 6.1–8.1)

## 2020-08-25 LAB — CBC WITH DIFFERENTIAL/PLATELET
Absolute Monocytes: 582 cells/uL (ref 200–950)
Basophils Absolute: 31 cells/uL (ref 0–200)
Basophils Relative: 0.6 %
Eosinophils Absolute: 120 cells/uL (ref 15–500)
Eosinophils Relative: 2.3 %
HCT: 47.9 % (ref 38.5–50.0)
Hemoglobin: 14.7 g/dL (ref 13.2–17.1)
Lymphs Abs: 1872 cells/uL (ref 850–3900)
MCH: 23 pg — ABNORMAL LOW (ref 27.0–33.0)
MCHC: 30.7 g/dL — ABNORMAL LOW (ref 32.0–36.0)
MCV: 75.1 fL — ABNORMAL LOW (ref 80.0–100.0)
MPV: 10.3 fL (ref 7.5–12.5)
Monocytes Relative: 11.2 %
Neutro Abs: 2595 cells/uL (ref 1500–7800)
Neutrophils Relative %: 49.9 %
Platelets: 265 10*3/uL (ref 140–400)
RBC: 6.38 10*6/uL — ABNORMAL HIGH (ref 4.20–5.80)
RDW: 13.1 % (ref 11.0–15.0)
Total Lymphocyte: 36 %
WBC: 5.2 10*3/uL (ref 3.8–10.8)

## 2020-09-03 ENCOUNTER — Other Ambulatory Visit: Payer: Self-pay

## 2020-09-03 ENCOUNTER — Other Ambulatory Visit (HOSPITAL_COMMUNITY)
Admission: RE | Admit: 2020-09-03 | Discharge: 2020-09-03 | Disposition: A | Discharge status: Home / Self Care | Payer: 59 | Source: Ambulatory Visit | Attending: Internal Medicine | Admitting: Internal Medicine

## 2020-09-03 ENCOUNTER — Encounter: Payer: Self-pay | Admitting: Internal Medicine

## 2020-09-03 ENCOUNTER — Ambulatory Visit (INDEPENDENT_AMBULATORY_CARE_PROVIDER_SITE_OTHER): Payer: 59 | Admitting: Internal Medicine

## 2020-09-03 VITALS — BP 143/86 | HR 88 | Temp 97.4°F | Wt 240.0 lb

## 2020-09-03 DIAGNOSIS — B20 Human immunodeficiency virus [HIV] disease: Secondary | ICD-10-CM | POA: Diagnosis present

## 2020-09-03 DIAGNOSIS — Z79899 Other long term (current) drug therapy: Secondary | ICD-10-CM

## 2020-09-03 DIAGNOSIS — R030 Elevated blood-pressure reading, without diagnosis of hypertension: Secondary | ICD-10-CM

## 2020-09-03 DIAGNOSIS — Z8619 Personal history of other infectious and parasitic diseases: Secondary | ICD-10-CM | POA: Diagnosis not present

## 2020-09-03 NOTE — Progress Notes (Signed)
RFV: follow up for hiv disease  Patient ID: Ryan Martinez, male   DOB: 13-Jun-1990, 30 y.o.   MRN: [id number]  HPI Ryan Martinez is a 30yo M with HIV disease,  CD 4 count 832/VL<20 on odesfey. Not missing any doses. 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  His health has been good overall, since last appt he did have an abscess lanced at urgent care.   Going on cruise to [place] for his birthday - may 9-13.   Outpatient Encounter Medications as of 09/03/2020  Medication Sig  . ODEFSEY 200-25-25 MG TABS tablet TAKE 1 TABLET BY MOUTH ONCE DAILY WITH BREAKFAST. STORE IN ORIGINAL CONTAINER AT ROOM TEMPERATURE.   No facility-administered encounter medications on file as of 09/03/2020.     Patient Active Problem List   Diagnosis Date Noted  . 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 History of syphilis 11/08/2015  . Pre-hypertension 09/15/2014  . History of MRSA infection 07/07/2011  . Human immunodeficiency virus (HIV) disease (HCC) 05/16/2011     Health Maintenance Due  Topic Date Due  . TETANUS/TDAP  Never done  . INFLUENZA VACCINE  01/08/2020     Review of Systems Review of Systems  Constitutional: Negative for fever, chills, diaphoresis, activity change, appetite change, fatigue and unexpected weight change.  HENT: Negative for congestion, sore throat, rhinorrhea, sneezing, trouble swallowing and sinus pressure.  Eyes: Negative for photophobia and visual disturbance.  Respiratory: Negative for cough, chest tightness, shortness of breath, wheezing and stridor.  Cardiovascular: Negative for chest pain, palpitations and leg swelling.  Gastrointestinal: Negative for nausea, vomiting, abdominal pain, diarrhea, constipation, blood in stool, abdominal distention and  anal bleeding.  Genitourinary: Negative for dysuria, hematuria, flank pain and difficulty urinating.  Musculoskeletal: Negative for myalgias, back pain, joint swelling, arthralgias and gait problem.  Skin: Negative for color change, pallor, rash and wound.  Neurological: Negative for dizziness, tremors, weakness and light-headedness.  Hematological: Negative for adenopathy. Does not bruise/bleed easily.  Psychiatric/Behavioral: Negative for behavioral problems, confusion, sleep disturbance, dysphoric mood, decreased concentration and agitation.    Physical Exam   BP (!) 143/86   Pulse 88   Temp (!) 97.4 F (36.3 C) (Oral)   Wt 240 lb (108.9 kg)   BMI 34.44 kg/m   Physical Exam  Constitutional: He is oriented to person, place, and time. He appears well-developed and well-nourished. No distress.  HENT:  Mouth/Throat: Oropharynx is clear and moist. No oropharyngeal exudate.  Cardiovascular: Normal rate, regular rhythm and normal heart sounds. Exam reveals no gallop and no friction rub.  No murmur heard.  Pulmonary/Chest: Effort normal and breath sounds normal. No respiratory distress. He has no wheezes.  Lymphadenopathy:  He has no cervical adenopathy.  Neurological: He is alert and oriented to person, place, and time.  Skin: Skin is warm and dry. No rash noted. No erythema.  Psychiatric: He has a normal mood and affect. His behavior is normal.    Lab Results  Component Value Date   CD4TCELL 48 08/21/2020   Lab Results  Component Value Date   CD4TABS 832 08/21/2020   CD4TABS 703 03/16/2020   CD4TABS 774 12/15/2019   Lab Results  Component Value Date   HIV1RNAQUANT <20 (H) 08/21/2020   Lab Results  Component Value Date  HEPBSAB NEG 05/13/2011   Lab Results  Component Value Date   LABRPR REACTIVE (A) 03/16/2020    CBC Lab Results  Component Value Date   WBC 5.2 08/21/2020   RBC 6.38 (H) 08/21/2020   HGB 14.7 08/21/2020   HCT 47.9 08/21/2020   PLT 265  08/21/2020   MCV 75.1 (L) 08/21/2020   MCH 23.0 (L) 08/21/2020   MCHC 30.7 (L) 08/21/2020   RDW 13.1 08/21/2020   LYMPHSABS 1,872 08/21/2020   MONOABS 670 06/10/2016   EOSABS 120 08/21/2020    BMET Lab Results  Component Value Date   NA 141 08/21/2020   K 4.4 08/21/2020   CL 106 08/21/2020   CO2 28 08/21/2020   GLUCOSE 89 08/21/2020   BUN 13 08/21/2020   CREATININE 1.09 08/21/2020   CALCIUM 9.5 08/21/2020   GFRNONAA 92 08/21/2020   GFRAA 106 08/21/2020      Assessment and Plan  HIV disease= well controlled. Continue on odefsey  Long term medication management= cr is stable  Hx of recurrent boils = is establishing care with derm this week  Pre-hypertension = planning on working on weight loss. Will check at next visit to see if need to start any antihypertensives  Sti testing= today since was not done since last fall  Hx of syphilis = will check rpr, since last RPR last fall  uptodate of vaccine

## 2020-09-03 NOTE — Patient Instructions (Signed)
In case you need covid testing prior to departure on cruise  [facility] in [place] = [phone] does M-F PCR testing

## 2020-09-04 ENCOUNTER — Ambulatory Visit: Payer: 59 | Admitting: Internal Medicine

## 2020-09-04 LAB — CYTOLOGY, (ORAL, ANAL, URETHRAL) ANCILLARY ONLY
Chlamydia: NEGATIVE
Chlamydia: NEGATIVE
Comment: NEGATIVE
Comment: NEGATIVE
Comment: NORMAL
Comment: NORMAL
Neisseria Gonorrhea: NEGATIVE
Neisseria Gonorrhea: NEGATIVE

## 2020-09-05 LAB — RPR: RPR Ser Ql: REACTIVE — AB

## 2020-09-05 LAB — FLUORESCENT TREPONEMAL AB(FTA)-IGG-BLD: Fluorescent Treponemal ABS: REACTIVE — AB

## 2020-09-05 LAB — RPR TITER: RPR Titer: 1:2 {titer} — ABNORMAL HIGH

## 2020-09-24 ENCOUNTER — Other Ambulatory Visit: Payer: Self-pay | Admitting: Internal Medicine

## 2020-09-24 DIAGNOSIS — B20 Human immunodeficiency virus [HIV] disease: Secondary | ICD-10-CM

## 2020-12-07 ENCOUNTER — Other Ambulatory Visit: Payer: Self-pay

## 2020-12-07 DIAGNOSIS — B20 Human immunodeficiency virus [HIV] disease: Secondary | ICD-10-CM

## 2020-12-07 DIAGNOSIS — Z79899 Other long term (current) drug therapy: Secondary | ICD-10-CM

## 2020-12-12 ENCOUNTER — Other Ambulatory Visit: Payer: 59

## 2020-12-12 ENCOUNTER — Other Ambulatory Visit: Payer: Self-pay

## 2020-12-12 DIAGNOSIS — B20 Human immunodeficiency virus [HIV] disease: Secondary | ICD-10-CM

## 2020-12-12 DIAGNOSIS — Z79899 Other long term (current) drug therapy: Secondary | ICD-10-CM

## 2020-12-13 ENCOUNTER — Other Ambulatory Visit: Payer: 59

## 2020-12-13 LAB — T-HELPER CELL (CD4) - (RCID CLINIC ONLY)
CD4 % Helper T Cell: 46 % (ref 33–65)
CD4 T Cell Abs: 725 /uL (ref 400–1790)

## 2020-12-14 LAB — LIPID PANEL
Cholesterol: 215 mg/dL — ABNORMAL HIGH (ref <200)
HDL: 44 mg/dL (ref >=40)
LDL Cholesterol (Calc): 126 mg/dL (calc) — ABNORMAL HIGH
Non-HDL Cholesterol (Calc): 171 mg/dL (calc) — ABNORMAL HIGH (ref <130)
Total CHOL/HDL Ratio: 4.9 (calc) (ref <5.0)
Triglycerides: 292 mg/dL — ABNORMAL HIGH (ref <150)

## 2020-12-14 LAB — COMPREHENSIVE METABOLIC PANEL
AG Ratio: 1.5 (calc) (ref 1.0–2.5)
ALT: 30 U/L (ref 9–46)
AST: 20 U/L (ref 10–40)
Albumin: 4.5 g/dL (ref 3.6–5.1)
Alkaline phosphatase (APISO): 64 U/L (ref 36–130)
BUN: 19 mg/dL (ref 7–25)
CO2: 27 mmol/L (ref 20–32)
Calcium: 9.9 mg/dL (ref 8.6–10.3)
Chloride: 102 mmol/L (ref 98–110)
Creat: 1.2 mg/dL (ref 0.60–1.35)
Globulin: 3.1 g/dL (calc) (ref 1.9–3.7)
Glucose, Bld: 92 mg/dL (ref 65–99)
Potassium: 4.4 mmol/L (ref 3.5–5.3)
Sodium: 138 mmol/L (ref 135–146)
Total Bilirubin: 0.7 mg/dL (ref 0.2–1.2)
Total Protein: 7.6 g/dL (ref 6.1–8.1)

## 2020-12-14 LAB — CBC WITH DIFFERENTIAL/PLATELET
Absolute Monocytes: 767 cells/uL (ref 200–950)
Basophils Absolute: 22 cells/uL (ref 0–200)
Basophils Relative: 0.4 %
Eosinophils Absolute: 168 cells/uL (ref 15–500)
Eosinophils Relative: 3 %
HCT: 49.9 % (ref 38.5–50.0)
Hemoglobin: 15.6 g/dL (ref 13.2–17.1)
Lymphs Abs: 1831 cells/uL (ref 850–3900)
MCH: 23.5 pg — ABNORMAL LOW (ref 27.0–33.0)
MCHC: 31.3 g/dL — ABNORMAL LOW (ref 32.0–36.0)
MCV: 75.3 fL — ABNORMAL LOW (ref 80.0–100.0)
MPV: 10.2 fL (ref 7.5–12.5)
Monocytes Relative: 13.7 %
Neutro Abs: 2811 cells/uL (ref 1500–7800)
Neutrophils Relative %: 50.2 %
Platelets: 291 10*3/uL (ref 140–400)
RBC: 6.63 10*6/uL — ABNORMAL HIGH (ref 4.20–5.80)
RDW: 13.3 % (ref 11.0–15.0)
Total Lymphocyte: 32.7 %
WBC: 5.6 10*3/uL (ref 3.8–10.8)

## 2020-12-14 LAB — HIV-1 RNA QUANT-NO REFLEX-BLD
HIV 1 RNA Quant: NOT DETECTED Copies/mL
HIV-1 RNA Quant, Log: NOT DETECTED Log cps/mL

## 2020-12-31 ENCOUNTER — Telehealth (INDEPENDENT_AMBULATORY_CARE_PROVIDER_SITE_OTHER): Payer: 59 | Admitting: Internal Medicine

## 2020-12-31 ENCOUNTER — Other Ambulatory Visit: Payer: Self-pay

## 2020-12-31 DIAGNOSIS — Z8619 Personal history of other infectious and parasitic diseases: Secondary | ICD-10-CM | POA: Diagnosis not present

## 2020-12-31 DIAGNOSIS — Z79899 Other long term (current) drug therapy: Secondary | ICD-10-CM

## 2020-12-31 DIAGNOSIS — B20 Human immunodeficiency virus [HIV] disease: Secondary | ICD-10-CM | POA: Diagnosis not present

## 2020-12-31 NOTE — Progress Notes (Signed)
Virtual Visit via Telephone Note  I connected with Ryan Martinez on 12/31/20 at  8:45 AM EDT by telephone and verified that I am speaking with the correct person using two identifiers.  Location: Patient: in car  Provider: in clinic   I discussed the limitations, risks, security and privacy concerns of performing an evaluation and management service by telephone and the availability of in person appointments. I also discussed with the patient that there may be a patient responsible charge related to this service. The patient expressed understanding and agreed to proceed.   History of Present Illness: Doing well with adherence. Interested in cabenuva injections. No recent issues with health. Reviewed lab results with patient. His partner/S.O. starting to be seen at [facility] No other partners No illnesses  12 point ros is negative   Observations/Objective: Fluent speech; answers questions appropriately  Assessment and Plan:  HIV disease = well controlled. Will look into cabenuva coverage. Plan to switch if it is covered, otherwise will give refills Long term medication management = Cr slightly increased on this visit (within range of what he has been in the past) Hx of syphilis = titer is stable  Follow Up Instructions: see back in 5 month at the latest if cabenuva is not covered: vs 1 month and then Q 2 months for cabenuva injection    I discussed the assessment and treatment plan with the patient. The patient was provided an opportunity to ask questions and all were answered. The patient agreed with the plan and demonstrated an understanding of the instructions.   The patient was advised to call back or seek an in-person evaluation if the symptoms worsen or if the condition fails to improve as anticipated.  I provided 10 minutes of non-face-to-face time during this encounter.   Judyann Munson, MD

## 2021-01-31 ENCOUNTER — Other Ambulatory Visit: Payer: Self-pay | Admitting: Internal Medicine

## 2021-01-31 ENCOUNTER — Telehealth: Payer: Self-pay

## 2021-01-31 ENCOUNTER — Other Ambulatory Visit (HOSPITAL_COMMUNITY): Payer: Self-pay

## 2021-01-31 DIAGNOSIS — B20 Human immunodeficiency virus [HIV] disease: Secondary | ICD-10-CM

## 2021-01-31 NOTE — Telephone Encounter (Signed)
Refill pending cabenuva coverage. Message sent to pharmacy team

## 2021-01-31 NOTE — Telephone Encounter (Signed)
Thank you patient made aware.  Ryan Martinez

## 2021-01-31 NOTE — Telephone Encounter (Signed)
One additional [facility] pending Cab[facility]va approval and appointment. Thank you!

## 2021-01-31 NOTE — Telephone Encounter (Signed)
Ryan Martinez is covered under patients medical coverage. I can call insurance to make sure there is no PA for Cabenuva.

## 2021-01-31 NOTE — Telephone Encounter (Signed)
[facility] Patient Advocate Encounter   Received notification from [employer] that prior authorization for Ryan Martinez is required.   PA submitted on 01/31/21  PA was started over the phone and I faxed chart notes to [phone]  Status is pending    [facility] will continue to follow.   Clearance Coots, CPhT Specialty Pharmacy Patient [facility] for Infectious Disease Phone: [phone] Fax:  [phone]

## 2021-02-13 ENCOUNTER — Encounter: Payer: Self-pay | Admitting: Podiatrist

## 2021-02-13 ENCOUNTER — Other Ambulatory Visit (HOSPITAL_COMMUNITY): Payer: Self-pay

## 2021-02-13 ENCOUNTER — Telehealth: Payer: Self-pay

## 2021-02-13 ENCOUNTER — Other Ambulatory Visit: Payer: Self-pay

## 2021-02-13 ENCOUNTER — Ambulatory Visit (INDEPENDENT_AMBULATORY_CARE_PROVIDER_SITE_OTHER): Payer: 59 | Admitting: Podiatrist

## 2021-02-13 DIAGNOSIS — B353 Tinea pedis: Secondary | ICD-10-CM | POA: Diagnosis not present

## 2021-02-13 MED ORDER — KETOCONAZOLE 2 % EX CREA: TOPICAL_CREAM | CUTANEOUS | 5 refills | Status: DC

## 2021-02-13 NOTE — Patient Instructions (Signed)
Athlete's Foot °Athlete's foot (tinea pedis) is a fungal infection of the skin on your feet. It often occurs on the skin that is between or underneath your toes. It can also occur on the soles of your feet. Symptoms include itchy or white and flaky areas on the skin. The infection can spread from person to person (is contagious). It can also spread when a person's bare feet come in contact with the fungus on shower floors or on items such as shoes. °Follow these instructions at home: °Medicines °Apply or take over-the-counter and prescription medicines only as told by your doctor. °Apply your antifungal medicine as told by your doctor. Do not stop using it even if your feet start to get better. °Foot care °Do not scratch your feet. °Keep your feet dry: °Wear cotton or wool socks. Change your socks every day or if they become wet. °Wear shoes that allow air to move around, such as sandals or canvas tennis shoes. °Wash and dry your feet: °Every day or as told by your doctor. °After exercising. °Including the area between your toes. °General instructions °Do not share any of these items that touch your feet: °Towels. °Shoes. °Nail clippers. °Other personal items. °Protect your feet by wearing sandals in wet areas, such as locker rooms and shared showers. °Keep all follow-up visits. °If you have diabetes, keep your blood sugar under control. °Contact a doctor if: °You have a fever. °You have swelling, pain, warmth, or redness in your foot. °Your feet are not getting better with treatment. °Your symptoms get worse. °You have new symptoms. °You have very bad pain. °Summary °Athlete's foot is a fungal infection of the skin on your feet. °This condition is caused by a fungus that grows in warm, moist places. °Symptoms include itchy or white and flaky areas on the skin. °Apply your antifungal medicine as told by your doctor. °Keep your feet clean and dry. °This information is not intended to replace advice given to you by  your health care provider. Make sure you discuss any questions you have with your health care provider. °Document Revised: 09/16/2020 Document Reviewed: 09/16/2020 °Elsevier Patient Education © 2022 Elsevier Inc. ° °

## 2021-02-13 NOTE — Progress Notes (Signed)
  Chief Complaint  Patient presents with   Toe Pain    Right 5th interdigital painful lesion   Foot Problem    Bilateral dry skin on feet      HPI: Patient is 30 y.o. male who presents today for a painful area in between the right fourth and fifth toes.  He states the skin between the toes becomes hard and then it peels off.  He also has dry skin on his feet he is unable to resolve with different creams he has tried.    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 STORE IN ORIGINAL CONTAINER AT ROOM TEMPERATURE. 30 tablet 0   HIBICLENS 4 % external liquid Apply topically daily as needed.     No current facility-administered medications on file prior to visit.    Allergies  Allergen Reactions   Morphine And Related Itching and Rash    Review of Systems No fevers, chills, nausea, muscle aches, no difficulty breathing, no calf pain, no chest pain or shortness of breath.   Physical Exam  GENERAL APPEARANCE: Alert, conversant. Appropriately groomed. No acute distress.   VASCULAR: Pedal pulses palpable DP and PT bilateral.  Capillary refill time is immediate to all digits,  Proximal to distal cooling it warm to warm.  Digital perfusion adequate.   NEUROLOGIC: sensation is intact to 5.07 monofilament at 5/5 sites bilateral.  Light touch is intact bilateral, vibratory sensation intact bilateral  MUSCULOSKELETAL: acceptable muscle strength, tone and stability bilateral.  No gross boney pedal  deformities noted.  No pain, crepitus or limitation noted with foot and ankle range of motion bilateral.   DERMATOLOGIC: skin is warm, supple, and dry.  Area of interdigital tinea noted fourth interspace right greater than left.  Slight peeling on the plantar aspect of bilateral feet in a moccasin-like distribution is also noted.  Assessment   1. Tinea pedis of right foot      Plan  Exam findings discussed with the patient.  Recommended a topical antifungal and gave instructions for use.  A prescription for ketoconazole was called into his pharmacy at today's visit.  If he continues to have the problem after using the medication for 4 weeks he will call.  Otherwise he will be seen back as needed for follow-up.

## 2021-02-13 NOTE — Telephone Encounter (Signed)
[facility] Patient Advocate Encounter  Received notification from [facility] that the request for prior authorization for Ryan Martinez has been denied due to patient will need to have tried and failed 3 formulary alternatives and they didn't work well or the patient had bad side effects.     We have done an Appeal and the Appeal was denied.    Clearance Coots, CPhT Specialty Pharmacy Patient [facility] for Infectious Disease Phone: [phone] Fax:  [phone]

## 2021-04-12 ENCOUNTER — Other Ambulatory Visit: Payer: Self-pay

## 2021-04-12 DIAGNOSIS — B20 Human immunodeficiency virus [HIV] disease: Secondary | ICD-10-CM

## 2021-04-12 MED ORDER — ODEFSEY 200-25-25 MG PO TABS: ORAL_TABLET | ORAL | 1 refills | Status: DC

## 2021-05-30 ENCOUNTER — Other Ambulatory Visit: Payer: Self-pay

## 2021-05-30 DIAGNOSIS — B20 Human immunodeficiency virus [HIV] disease: Secondary | ICD-10-CM

## 2021-05-30 DIAGNOSIS — Z113 Encounter for screening for infections with a predominantly sexual mode of transmission: Secondary | ICD-10-CM

## 2021-06-06 ENCOUNTER — Other Ambulatory Visit: Payer: Self-pay

## 2021-06-06 ENCOUNTER — Other Ambulatory Visit: Payer: 59

## 2021-06-06 DIAGNOSIS — Z113 Encounter for screening for infections with a predominantly sexual mode of transmission: Secondary | ICD-10-CM

## 2021-06-06 DIAGNOSIS — B20 Human immunodeficiency virus [HIV] disease: Secondary | ICD-10-CM

## 2021-06-07 LAB — T-HELPER CELL (CD4) - (RCID CLINIC ONLY)
CD4 % Helper T Cell: 45 % (ref 33–65)
CD4 T Cell Abs: 865 /uL (ref 400–1790)

## 2021-06-10 ENCOUNTER — Other Ambulatory Visit: Payer: Self-pay | Admitting: Internal Medicine

## 2021-06-10 DIAGNOSIS — B20 Human immunodeficiency virus [HIV] disease: Secondary | ICD-10-CM

## 2021-06-11 LAB — FLUORESCENT TREPONEMAL AB(FTA)-IGG-BLD: Fluorescent Treponemal ABS: REACTIVE — AB

## 2021-06-11 LAB — COMPLETE METABOLIC PANEL WITH GFR
AG Ratio: 1.6 (calc) (ref 1.0–2.5)
ALT: 27 U/L (ref 9–46)
AST: 18 U/L (ref 10–40)
Albumin: 4.4 g/dL (ref 3.6–5.1)
Alkaline phosphatase (APISO): 67 U/L (ref 36–130)
BUN: 13 mg/dL (ref 7–25)
CO2: 27 mmol/L (ref 20–32)
Calcium: 9.7 mg/dL (ref 8.6–10.3)
Chloride: 105 mmol/L (ref 98–110)
Creat: 1.13 mg/dL (ref 0.60–1.24)
Globulin: 2.8 g/dL (calc) (ref 1.9–3.7)
Glucose, Bld: 98 mg/dL (ref 65–99)
Potassium: 4.3 mmol/L (ref 3.5–5.3)
Sodium: 140 mmol/L (ref 135–146)
Total Bilirubin: 0.7 mg/dL (ref 0.2–1.2)
Total Protein: 7.2 g/dL (ref 6.1–8.1)
eGFR: 90 mL/min/{1.73_m2} (ref >=60)

## 2021-06-11 LAB — CBC WITH DIFFERENTIAL/PLATELET
Absolute Monocytes: 732 cells/uL (ref 200–950)
Basophils Absolute: 31 cells/uL (ref 0–200)
Basophils Relative: 0.5 %
Eosinophils Absolute: 99 cells/uL (ref 15–500)
Eosinophils Relative: 1.6 %
HCT: 48.7 % (ref 38.5–50.0)
Hemoglobin: 15.3 g/dL (ref 13.2–17.1)
Lymphs Abs: 2102 cells/uL (ref 850–3900)
MCH: 23.4 pg — ABNORMAL LOW (ref 27.0–33.0)
MCHC: 31.4 g/dL — ABNORMAL LOW (ref 32.0–36.0)
MCV: 74.6 fL — ABNORMAL LOW (ref 80.0–100.0)
MPV: 10 fL (ref 7.5–12.5)
Monocytes Relative: 11.8 %
Neutro Abs: 3236 cells/uL (ref 1500–7800)
Neutrophils Relative %: 52.2 %
Platelets: 306 10*3/uL (ref 140–400)
RBC: 6.53 10*6/uL — ABNORMAL HIGH (ref 4.20–5.80)
RDW: 14 % (ref 11.0–15.0)
Total Lymphocyte: 33.9 %
WBC: 6.2 10*3/uL (ref 3.8–10.8)

## 2021-06-11 LAB — HIV-1 RNA QUANT-NO REFLEX-BLD
HIV 1 RNA Quant: NOT DETECTED Copies/mL
HIV-1 RNA Quant, Log: NOT DETECTED Log cps/mL

## 2021-06-11 LAB — RPR: RPR Ser Ql: REACTIVE — AB

## 2021-06-11 LAB — RPR TITER: RPR Titer: 1:2 {titer} — ABNORMAL HIGH

## 2021-06-17 ENCOUNTER — Ambulatory Visit (INDEPENDENT_AMBULATORY_CARE_PROVIDER_SITE_OTHER): Payer: 59 | Admitting: Internal Medicine

## 2021-06-17 ENCOUNTER — Ambulatory Visit (INDEPENDENT_AMBULATORY_CARE_PROVIDER_SITE_OTHER): Payer: 59

## 2021-06-17 ENCOUNTER — Other Ambulatory Visit: Payer: Self-pay

## 2021-06-17 ENCOUNTER — Encounter: Payer: Self-pay | Admitting: Internal Medicine

## 2021-06-17 VITALS — BP 134/85 | HR 96 | Temp 97.4°F | Wt 252.0 lb

## 2021-06-17 DIAGNOSIS — Z23 Encounter for immunization: Secondary | ICD-10-CM | POA: Diagnosis not present

## 2021-06-17 DIAGNOSIS — E781 Pure hyperglyceridemia: Secondary | ICD-10-CM | POA: Diagnosis not present

## 2021-06-17 DIAGNOSIS — B20 Human immunodeficiency virus [HIV] disease: Secondary | ICD-10-CM

## 2021-06-17 MED ORDER — ODEFSEY 200-25-25 MG PO TABS: ORAL_TABLET | ORAL | 11 refills | Status: DC

## 2021-06-17 NOTE — Progress Notes (Signed)
RFV: follow up for hiv disease  Patient ID: Ryan Martinez, male   DOB: 23-Jan-1991, 31 y.o.   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 Upcoming travel includes =--Going to [place] for training. Going to [place], [place], [place] - cruise for 7 days.   - he was denied cabenuva by his insurance   Father diagnosed with colon cancer this past year, s/p resection with chemo. Now having total colectomy surgery  Family hx = colon ca at age of 66; paternal GM also had colon cancer.  Outpatient Encounter Medications as of 06/17/2021  Medication Sig   clindamycin (CLINDAGEL) 1 % gel APPLY TO AFFECTED AREA TWICE A DAY   emtricitabine-rilpivir-tenofovir AF (ODEFSEY) 200-25-25 MG TABS tablet TAKE ONE TABLET BY MOUTH ONCE DAILY WITH BREAKFAST. STORE IN ORIGINAL CONTAINER AT ROOM TEMPERATURE.   HIBICLENS 4 % external liquid Apply topically daily as needed.   ketoconazole (NIZORAL) 2 % cream Apply to bottoms of feet and between toes twice a day   No facility-administered encounter medications on file as of 06/17/2021.     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  HENT: Negative for congestion, sore throat, rhinorrhea, sneezing, trouble swallowing and sinus pressure.  Eyes: Negative for photophobia and visual disturbance.   Respiratory: Negative for cough, chest tightness, shortness of breath, wheezing and stridor.  Cardiovascular: Negative for chest pain, palpitations and leg swelling.  Gastrointestinal: Negative for nausea, vomiting, abdominal pain, diarrhea, constipation, blood in stool, abdominal distention and anal bleeding.  Genitourinary: Negative for dysuria, hematuria, flank pain and difficulty urinating.  Musculoskeletal: Negative for myalgias, back pain, joint swelling, arthralgias and gait problem.  Skin: Negative for color change, pallor, rash and wound.  Neurological: Negative for dizziness, tremors, weakness and light-headedness.  Hematological: Negative for adenopathy. Does not bruise/bleed easily.  Psychiatric/Behavioral: Negative for behavioral problems, confusion, sleep disturbance, dysphoric mood, decreased concentration and agitation.   Physical Exam   BP 134/85    Pulse 96    Temp (!) 97.4 F (36.3 C) (Oral)    Wt 252 lb (114.3 kg)    BMI 36.16 kg/m   Physical Exam  Constitutional: He is oriented to person, place, and time. He appears well-developed and well-nourished. No distress.  HENT:  Mouth/Throat: Oropharynx is clear and moist. No oropharyngeal exudate.  Cardiovascular: Normal rate, regular rhythm and normal heart sounds. Exam reveals no gallop and no friction rub.  No murmur heard.  Pulmonary/Chest: Effort normal and breath sounds normal. No respiratory distress. He has no wheezes.  Abdominal: Soft. Bowel sounds are normal. He exhibits no distension. There is no tenderness.  Lymphadenopathy:  He has no cervical adenopathy.  Neurological: He is alert and oriented to person, place, and time.  Skin: Skin is warm and dry. No rash noted. No erythema.  Psychiatric: He has a normal mood and affect. His behavior is normal.   Lab Results  Component Value Date   CD4TCELL 45 06/06/2021   Lab Results  Component Value Date   CD4TABS 865 06/06/2021   CD4TABS 725 12/12/2020   CD4TABS  832 08/21/2020   Lab Results  Component Value Date   HIV1RNAQUANT Not Detected 06/06/2021   Lab Results  Component Value Date   HEPBSAB NEG 05/13/2011   Lab Results  Component Value Date   LABRPR REACTIVE (A) 06/06/2021    CBC Lab Results  Component Value Date   WBC 6.2 06/06/2021   RBC 6.53 (H) 06/06/2021   HGB 15.3 06/06/2021   HCT 48.7 06/06/2021   PLT 306 06/06/2021   MCV 74.6 (L) 06/06/2021   MCH 23.4 (L) 06/06/2021   MCHC 31.4 (L) 06/06/2021   RDW 14.0 06/06/2021   LYMPHSABS 2,102 06/06/2021   MONOABS 670 06/10/2016   EOSABS 99 06/06/2021    BMET Lab Results  Component Value Date   NA 140 06/06/2021   K 4.3 06/06/2021   CL 105 06/06/2021   CO2 27 06/06/2021   GLUCOSE 98 06/06/2021   BUN 13 06/06/2021   CREATININE 1.13 06/06/2021   CALCIUM 9.7 06/06/2021   GFRNONAA 92 08/21/2020   GFRAA 106 08/21/2020      Assessment and Plan Hiv disease= will refill odefsey, though may consider changing to juluca if lipids elevated;   Long term medication management = cr is stable  Health maintenance = plan for Getting flu/bivalent today  Family hx of colon cancer = still keep on track to get colonoscopy at age 30. Recommend to have his brother who is 45 to get colonoscopy (pending his findings, may move up screening for Randee)  Hypertriglyceridemia = will get repeat fasting lipid profile to see if can do diet modification vs. Need of meds

## 2021-06-17 NOTE — Progress Notes (Signed)
° °  Covid-19 Vaccination Clinic  Name:  Ryan Martinez    MRN: [id number] DOB: 1990-10-22  06/17/2021  [name] was observed post Covid-19 immunization for 15 minutes without incident. He was provided with Vaccine Information Sheet and instruction to access the V-Safe system.   [name] was instructed to call 911 with any severe reactions post vaccine: Difficulty breathing  Swelling of face and throat  A fast heartbeat  A bad rash all over body  Dizziness and weakness   Immunizations Administered     Name Date Dose VIS Date Route   Pfizer Covid-19 Vaccine Bivalent Booster 06/17/2021 12:19 PM 0.3 mL 02/06/2021 Intramuscular   Manufacturer: [employer], [employer]   Lot: [id number]   NDC: [phone]      Lariya Kinzie T Pricilla Loveless

## 2021-06-19 ENCOUNTER — Other Ambulatory Visit: Payer: 59

## 2021-06-19 ENCOUNTER — Other Ambulatory Visit: Payer: Self-pay

## 2021-06-19 DIAGNOSIS — E781 Pure hyperglyceridemia: Secondary | ICD-10-CM

## 2021-06-19 DIAGNOSIS — B20 Human immunodeficiency virus [HIV] disease: Secondary | ICD-10-CM

## 2021-06-19 LAB — LIPID PANEL
Cholesterol: 186 mg/dL (ref <200)
HDL: 36 mg/dL — ABNORMAL LOW (ref >=40)
LDL Cholesterol (Calc): 122 mg/dL (calc) — ABNORMAL HIGH
Non-HDL Cholesterol (Calc): 150 mg/dL (calc) — ABNORMAL HIGH (ref <130)
Total CHOL/HDL Ratio: 5.2 (calc) — ABNORMAL HIGH (ref <5.0)
Triglycerides: 166 mg/dL — ABNORMAL HIGH (ref <150)

## 2021-08-07 ENCOUNTER — Other Ambulatory Visit (HOSPITAL_COMMUNITY): Payer: Self-pay

## 2021-09-11 ENCOUNTER — Encounter: Payer: Self-pay | Admitting: Internal Medicine

## 2021-09-11 ENCOUNTER — Other Ambulatory Visit (HOSPITAL_COMMUNITY): Payer: Self-pay

## 2021-09-11 NOTE — Telephone Encounter (Signed)
Is this something he could do with his insurance now ?

## 2021-09-16 ENCOUNTER — Telehealth: Payer: Self-pay

## 2021-09-16 NOTE — Telephone Encounter (Signed)
Patient called office today requesting appointment for STD testing. States that his partner recently tested positive for Gonorrhea and was treated. States they did not use condoms during last encounter. Denies any symptoms.  ?Requested early morning appointment due to work schedule. Scheduled to see Durwin Nora, NP tomorrow. ?Juanita Laster, RMA ? ?

## 2021-09-17 ENCOUNTER — Telehealth: Payer: Self-pay

## 2021-09-17 ENCOUNTER — Encounter: Payer: Self-pay | Admitting: Infectious Diseases

## 2021-09-17 ENCOUNTER — Other Ambulatory Visit (HOSPITAL_COMMUNITY): Payer: Self-pay

## 2021-09-17 ENCOUNTER — Ambulatory Visit (INDEPENDENT_AMBULATORY_CARE_PROVIDER_SITE_OTHER): Payer: 59 | Admitting: Infectious Diseases

## 2021-09-17 ENCOUNTER — Other Ambulatory Visit: Payer: Self-pay

## 2021-09-17 ENCOUNTER — Other Ambulatory Visit (HOSPITAL_COMMUNITY)
Admission: RE | Admit: 2021-09-17 | Discharge: 2021-09-17 | Disposition: A | Discharge status: Home / Self Care | Payer: 59 | Source: Ambulatory Visit | Attending: Infectious Diseases | Admitting: Infectious Diseases

## 2021-09-17 VITALS — BP 134/92 | HR 103 | Temp 98.9°F | Resp 16 | Ht 71.0 in | Wt 248.0 lb

## 2021-09-17 DIAGNOSIS — Z113 Encounter for screening for infections with a predominantly sexual mode of transmission: Secondary | ICD-10-CM

## 2021-09-17 DIAGNOSIS — Z202 Contact with and (suspected) exposure to infections with a predominantly sexual mode of transmission: Secondary | ICD-10-CM | POA: Insufficient documentation

## 2021-09-17 DIAGNOSIS — B20 Human immunodeficiency virus [HIV] disease: Secondary | ICD-10-CM

## 2021-09-17 MED ORDER — CEFTRIAXONE SODIUM 500 MG IJ SOLR
500.0000 mg | Freq: Once | INTRAMUSCULAR | Status: AC
  Administered 2021-09-17: 500 mg via INTRAMUSCULAR

## 2021-09-17 NOTE — Patient Instructions (Signed)
We gave you treatment today - will let you know what your test results show when they come back. Hopefully this week.  ? ?Regarding the [place] - Our pharmacy team will need to do a prior authorization for you to see if it is now covered through your insurance. We are hopeful to provide you with an update on this next week.  ? ? ?

## 2021-09-17 NOTE — Telephone Encounter (Signed)
[facility] Patient Advocate Encounter ?  ?Received notification from [facility] that prior authorization for [name] is required. ?  ?PA submitted on 09/17/21 ?Key [id number] ?Status is pending ?   ?[facility] will continue to follow. ? ? ?Clearance Coots, CPhT ?Specialty Pharmacy Patient Advocate ?[facility] ?Phone: [phone] ?Fax:  [phone]  ?

## 2021-09-17 NOTE — Progress Notes (Signed)
? ?  ? ?Subjective:  ? ?  ?Ryan Martinez is a 31 y.o. male patient here today with concern over STI exposure/symptoms .  ?6y ?Chief Complaint  ?Patient presents with  ? Exposure to STD  ?  Patient reports possible exposure to STD from partner - denies any sx's.   ?  ? ?He follows with Dr. Drue Second here at [facility] for well controlled HIV, maintained on once daily Odefsey. Highly motivated for switching to [place] and spent time discussing this today.  ? ?Exposure to gonorrhea through partner - no symptoms today.  ? ? ? ?Review of Systems: ?Review of Systems  ?Constitutional:  Negative for chills and fever.  ?HENT:  Negative for sore throat.   ?Eyes:  Negative for visual disturbance.  ?Gastrointestinal:  Negative for abdominal pain, anal bleeding and rectal pain.  ?Genitourinary:  Negative for dysuria, genital sores, penile discharge, penile pain, scrotal swelling and testicular pain.  ?Musculoskeletal:  Negative for arthralgias and joint swelling.  ?Skin:  Negative for rash.  ?Neurological:  Negative for headaches.  ?Hematological:  Negative for adenopathy.  ? ?Sexual History:  ?Prefers male partners with genital and extragenital sites involved ?Condom use inconsistent ? ? ?Past Medical History:  ?Diagnosis Date  ? HIV (human immunodeficiency virus infection) (HCC)   ? ? ?Outpatient Medications Prior to Visit  ?Medication Sig Dispense Refill  ? clindamycin (CLINDAGEL) 1 % gel APPLY TO AFFECTED AREA TWICE A DAY    ? emtricitabine-rilpivir-tenofovir AF (ODEFSEY) 200-25-25 MG TABS tablet TAKE ONE TABLET BY MOUTH ONCE DAILY WITH BREAKFAST. STORE IN ORIGINAL CONTAINER AT ROOM TEMPERATURE. 30 tablet 11  ? HIBICLENS 4 % external liquid Apply topically daily as needed.    ? ketoconazole (NIZORAL) 2 % cream Apply to bottoms of feet and between toes twice a day 60 g 5  ? ?No facility-administered medications prior to visit.  ? ? ?Allergies  ?Allergen Reactions  ? Morphine And Related Itching and Rash  ? ? ?Family History   ?Problem Relation Age of Onset  ? Sarcoidosis Mother   ? Cancer Maternal Aunt   ? ?  ? ? ? ?Objective:  ?Objective  ?Vitals:  ? 09/17/21 0841  ?BP: (!) 134/92  ?Pulse: (!) 103  ?Resp: 16  ?Temp: 98.9 ?F (37.2 ?C)  ?SpO2: 96%  ? ?Body mass index is 34.59 kg/m?. ? ? ?Physical Exam ?HENT:  ?   Mouth/Throat:  ?   Mouth: No oral lesions.  ?   Dentition: Normal dentition. No dental caries.  ?Eyes:  ?   General: No scleral icterus. ?Cardiovascular:  ?   Rate and Rhythm: Normal rate and regular rhythm.  ?   Heart sounds: Normal heart sounds.  ?Pulmonary:  ?   Effort: Pulmonary effort is normal.  ?   Breath sounds: Normal breath sounds.  ?Abdominal:  ?   General: There is no distension.  ?   Palpations: Abdomen is soft.  ?   Tenderness: There is no abdominal tenderness.  ?Lymphadenopathy:  ?   Cervical: No cervical adenopathy.  ?Skin: ?   General: Skin is warm and dry.  ?   Findings: No rash.  ?Neurological:  ?   Mental Status: He is alert and oriented to person, place, and time.  ? ?  ? ?Assessment & Plan:   ? ?Problem List Items Addressed This Visit   ? ?  ? Unprioritized  ? Exposure to gonorrhea - Primary  ?  Asymptomatic today with exposure about 3 weeks  ago. We talked about options and will proceed with presumptive treatment today given his partner tested positive.  ?Urethral and extragenital swabs collected today.  ?RPR for syphilis screen - though may be to be early and best to be repeated in a few months for best assurance.  ? ?  ?  ? Relevant Orders  ? Cytology (oral, anal, urethral) ancillary only  ? Cytology (oral, anal, urethral) ancillary only  ? Urine cytology ancillary only  ? Human immunodeficiency virus (HIV) disease (HCC)  ?  We had a good discussion about cabenuva today. I think he is a good candidate for this and will update pharmacy team to proceed with PA.  ?Counseled on transition from oral regimen to injectable - continue Odefsey once daily with food until we have injection appt set. Loading dose and  1st maintenance 30-d apart then every 2 months from there. Talked about target date and scheduling window for these treatments as well as viral load monitoring during the first 6 months every injection.  ? ?He is hopeful we can secure this for him. Hopeful we can provide him an update next week.   ?  ?  ? ?Other Visit Diagnoses   ? ? Screening examination for venereal disease      ? Relevant Orders  ? RPR  ? ?  ? ? ? ?Rexene Alberts, MSN, NP-C ?[facility] ?[facility] ?Office: [phone] ?Pager: [phone] ? ?

## 2021-09-17 NOTE — Assessment & Plan Note (Signed)
Asymptomatic today with exposure about 3 weeks ago. We talked about options and will proceed with presumptive treatment today given his partner tested positive.  ?Urethral and extragenital swabs collected today.  ?RPR for syphilis screen - though may be to be early and best to be repeated in a few months for best assurance.  ? ?

## 2021-09-17 NOTE — Assessment & Plan Note (Signed)
We had a good discussion about cabenuva today. I think he is a good candidate for this and will update pharmacy team to proceed with PA.  ?Counseled on transition from oral regimen to injectable - continue Odefsey once daily with food until we have injection appt set. Loading dose and 1st maintenance 30-d apart then every 2 months from there. Talked about target date and scheduling window for these treatments as well as viral load monitoring during the first 6 months every injection.  ? ?He is hopeful we can secure this for him. Hopeful we can provide him an update next week.   ?

## 2021-09-18 ENCOUNTER — Other Ambulatory Visit (HOSPITAL_COMMUNITY): Payer: Self-pay

## 2021-09-18 LAB — CYTOLOGY, (ORAL, ANAL, URETHRAL) ANCILLARY ONLY
Chlamydia: NEGATIVE
Chlamydia: NEGATIVE
Comment: NEGATIVE
Comment: NEGATIVE
Comment: NORMAL
Comment: NORMAL
Neisseria Gonorrhea: NEGATIVE
Neisseria Gonorrhea: NEGATIVE

## 2021-09-18 LAB — URINE CYTOLOGY ANCILLARY ONLY
Chlamydia: NEGATIVE
Comment: NEGATIVE
Comment: NEGATIVE
Comment: NORMAL
Neisseria Gonorrhea: NEGATIVE
Trichomonas: NEGATIVE

## 2021-09-18 LAB — RPR: RPR Ser Ql: NONREACTIVE

## 2021-09-24 ENCOUNTER — Encounter: Payer: Self-pay | Admitting: Internal Medicine

## 2021-09-27 NOTE — Telephone Encounter (Signed)
No he has not been approved yet , pharmacy benefits was denied now working on medical benefits which is taking longer than expected once I can get an approval I will reach out to the patient.

## 2021-10-01 ENCOUNTER — Encounter: Payer: Self-pay | Admitting: Pharmacist

## 2021-10-04 ENCOUNTER — Telehealth: Payer: Self-pay | Admitting: Student-PharmD

## 2021-10-04 NOTE — Telephone Encounter (Signed)
Patient's original [place] PA denied. Submitted and faxed appeal for [place] to patient's insurance. Will await response. ? ?Gaye Alken, PharmD Candidate ?10/04/2021 10:09 AM ? ?

## 2021-10-16 ENCOUNTER — Other Ambulatory Visit (HOSPITAL_COMMUNITY): Payer: Self-pay

## 2021-12-02 ENCOUNTER — Other Ambulatory Visit: Payer: 59

## 2021-12-02 ENCOUNTER — Other Ambulatory Visit: Payer: Self-pay

## 2021-12-02 DIAGNOSIS — Z79899 Other long term (current) drug therapy: Secondary | ICD-10-CM

## 2021-12-02 DIAGNOSIS — B20 Human immunodeficiency virus [HIV] disease: Secondary | ICD-10-CM

## 2021-12-03 LAB — T-HELPER CELL (CD4) - (RCID CLINIC ONLY)
CD4 % Helper T Cell: 41 % (ref 33–65)
CD4 T Cell Abs: 580 /uL (ref 400–1790)

## 2021-12-04 ENCOUNTER — Other Ambulatory Visit: Payer: 59

## 2021-12-06 LAB — CBC WITH DIFFERENTIAL/PLATELET
Absolute Monocytes: 726 cells/uL (ref 200–950)
Basophils Absolute: 40 cells/uL (ref 0–200)
Basophils Relative: 0.6 %
Eosinophils Absolute: 73 cells/uL (ref 15–500)
Eosinophils Relative: 1.1 %
HCT: 49.2 % (ref 38.5–50.0)
Hemoglobin: 15.7 g/dL (ref 13.2–17.1)
Lymphs Abs: 1683 cells/uL (ref 850–3900)
MCH: 23.7 pg — ABNORMAL LOW (ref 27.0–33.0)
MCHC: 31.9 g/dL — ABNORMAL LOW (ref 32.0–36.0)
MCV: 74.2 fL — ABNORMAL LOW (ref 80.0–100.0)
MPV: 9.6 fL (ref 7.5–12.5)
Monocytes Relative: 11 %
Neutro Abs: 4079 cells/uL (ref 1500–7800)
Neutrophils Relative %: 61.8 %
Platelets: 284 10*3/uL (ref 140–400)
RBC: 6.63 10*6/uL — ABNORMAL HIGH (ref 4.20–5.80)
RDW: 13.7 % (ref 11.0–15.0)
Total Lymphocyte: 25.5 %
WBC: 6.6 10*3/uL (ref 3.8–10.8)

## 2021-12-06 LAB — COMPLETE METABOLIC PANEL WITH GFR
AG Ratio: 1.8 (calc) (ref 1.0–2.5)
ALT: 28 U/L (ref 9–46)
AST: 17 U/L (ref 10–40)
Albumin: 4.8 g/dL (ref 3.6–5.1)
Alkaline phosphatase (APISO): 79 U/L (ref 36–130)
BUN: 15 mg/dL (ref 7–25)
CO2: 29 mmol/L (ref 20–32)
Calcium: 9.9 mg/dL (ref 8.6–10.3)
Chloride: 102 mmol/L (ref 98–110)
Creat: 1.13 mg/dL (ref 0.60–1.26)
Globulin: 2.6 g/dL (calc) (ref 1.9–3.7)
Glucose, Bld: 89 mg/dL (ref 65–99)
Potassium: 4.4 mmol/L (ref 3.5–5.3)
Sodium: 137 mmol/L (ref 135–146)
Total Bilirubin: 1.2 mg/dL (ref 0.2–1.2)
Total Protein: 7.4 g/dL (ref 6.1–8.1)
eGFR: 90 mL/min/{1.73_m2} (ref >=60)

## 2021-12-06 LAB — LIPID PANEL
Cholesterol: 180 mg/dL (ref <200)
HDL: 44 mg/dL (ref >=40)
LDL Cholesterol (Calc): 102 mg/dL (calc) — ABNORMAL HIGH
Non-HDL Cholesterol (Calc): 136 mg/dL (calc) — ABNORMAL HIGH (ref <130)
Total CHOL/HDL Ratio: 4.1 (calc) (ref <5.0)
Triglycerides: 219 mg/dL — ABNORMAL HIGH (ref <150)

## 2021-12-06 LAB — HIV-1 RNA QUANT-NO REFLEX-BLD
HIV 1 RNA Quant: <20 Copies/mL — ABNORMAL HIGH
HIV-1 RNA Quant, Log: <1.3 Log cps/mL — ABNORMAL HIGH

## 2021-12-16 ENCOUNTER — Encounter: Payer: Self-pay | Admitting: Internal Medicine

## 2021-12-16 ENCOUNTER — Ambulatory Visit (INDEPENDENT_AMBULATORY_CARE_PROVIDER_SITE_OTHER): Payer: 59 | Admitting: Internal Medicine

## 2021-12-16 ENCOUNTER — Other Ambulatory Visit: Payer: Self-pay

## 2021-12-16 VITALS — BP 121/86 | HR 100 | Temp 98.0°F | Ht 71.0 in | Wt 239.0 lb

## 2021-12-16 DIAGNOSIS — Z8619 Personal history of other infectious and parasitic diseases: Secondary | ICD-10-CM

## 2021-12-16 DIAGNOSIS — Z79899 Other long term (current) drug therapy: Secondary | ICD-10-CM

## 2021-12-16 DIAGNOSIS — Z23 Encounter for immunization: Secondary | ICD-10-CM

## 2021-12-16 DIAGNOSIS — B20 Human immunodeficiency virus [HIV] disease: Secondary | ICD-10-CM | POA: Diagnosis not present

## 2021-12-16 DIAGNOSIS — Z716 Tobacco abuse counseling: Secondary | ICD-10-CM

## 2021-12-16 MED ORDER — ODEFSEY 200-25-25 MG PO TABS: ORAL_TABLET | ORAL | 11 refills | Status: DC

## 2021-12-16 NOTE — Progress Notes (Signed)
RFV: follow for HIV disease  Patient ID: Ryan Martinez, male   DOB: [birth year], 31 y.o.   MRN: [id number]  HPI Ryan Martinez is 31 yo. M with well controlled hiv disease doing well no health complaints  Intentionally losing weight 7 lb in the last 2 weeks. Has been increasing his work outs to 5 days per week includes 1 hr of cycling. Doing well otherwise.  Sochx: still living with his partner, but having a break. Mother living with them temporarily  Outpatient Encounter Medications as of 12/16/2021  Medication Sig   clindamycin (CLINDAGEL) 1 % gel APPLY TO AFFECTED AREA TWICE A DAY   emtricitabine-rilpivir-tenofovir AF (ODEFSEY) 200-25-25 MG TABS tablet TAKE ONE TABLET BY MOUTH ONCE DAILY WITH BREAKFAST. STORE IN ORIGINAL CONTAINER AT ROOM TEMPERATURE.   HIBICLENS 4 % external liquid Apply topically daily as needed.   ketoconazole (NIZORAL) 2 % cream Apply to bottoms of feet and between toes twice a day   No facility-administered encounter medications on file as of 12/16/2021.     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  HENT: Negative for congestion, sore throat, rhinorrhea, sneezing, trouble swallowing and sinus pressure.  Eyes: Negative for photophobia and visual disturbance.  Respiratory: Negative for cough, chest tightness, shortness of breath, wheezing and stridor.  Cardiovascular: Negative for chest pain, palpitations and leg swelling.  Gastrointestinal: Negative  for nausea, vomiting, abdominal pain, diarrhea, constipation, blood in stool, abdominal distention and anal bleeding.  Genitourinary: Negative for dysuria, hematuria, flank pain and difficulty urinating.  Musculoskeletal: Negative for myalgias, back pain, joint swelling, arthralgias and gait problem.  Skin: Negative for color change, pallor, rash and wound.  Neurological: Negative for dizziness, tremors, weakness and light-headedness.  Hematological: Negative for adenopathy. Does not bruise/bleed easily.  Psychiatric/Behavioral: Negative for behavioral problems, confusion, sleep disturbance, dysphoric mood, decreased concentration and agitation.   Physical Exam   BP (!) 151/91   Pulse 100   Temp 98 F (36.7 C) (Oral)   Ht 5\' 11"  (1.803 m)   Wt 239 lb (108.4 kg)   SpO2 98%   BMI 33.33 kg/m   Physical Exam  Constitutional: He is oriented to person, place, and time. He appears well-developed and well-nourished. No distress.  HENT:  Mouth/Throat: Oropharynx is clear and moist. No oropharyngeal exudate.  Cardiovascular: Normal rate, regular rhythm and normal heart sounds. Exam reveals no gallop and no friction rub.  No murmur heard.  Pulmonary/Chest: Effort normal and breath sounds normal. No respiratory distress. He has no wheezes.  Lymphadenopathy:  He has no cervical adenopathy.  Neurological: He is alert and oriented to person, place, and time.  Skin: Skin is warm and dry. No rash noted. No erythema.  Psychiatric: He has a normal mood and affect. His behavior is normal.   Lab Results  Component Value Date   CD4TCELL 41 12/02/2021   Lab Results  Component Value Date   CD4TABS 580 12/02/2021   CD4TABS 865 06/06/2021   CD4TABS 725 12/12/2020  Lab Results  Component Value Date   HIV1RNAQUANT <20 (H) 12/02/2021   Lab Results  Component Value Date   HEPBSAB NEG 05/13/2011   Lab Results  Component Value Date   LABRPR NON-REACTIVE 09/17/2021    CBC Lab Results   Component Value Date   WBC 6.6 12/02/2021   RBC 6.63 (H) 12/02/2021   HGB 15.7 12/02/2021   HCT 49.2 12/02/2021   PLT 284 12/02/2021   MCV 74.2 (L) 12/02/2021   MCH 23.7 (L) 12/02/2021   MCHC 31.9 (L) 12/02/2021   RDW 13.7 12/02/2021   LYMPHSABS 1,683 12/02/2021   MONOABS 670 06/10/2016   EOSABS 73 12/02/2021    BMET Lab Results  Component Value Date   NA 137 12/02/2021   K 4.4 12/02/2021   CL 102 12/02/2021   CO2 29 12/02/2021   GLUCOSE 89 12/02/2021   BUN 15 12/02/2021   CREATININE 1.13 12/02/2021   CALCIUM 9.9 12/02/2021   GFRNONAA 92 08/21/2020   GFRAA 106 08/21/2020      Assessment and Plan  HIV disease = well controlled, though CD 4 count dropped below his baseline. Continue to encourage excellent adherence  Long term medication management = cr is stable  Prehypertension = repeat BP is 121/86. No need for meds  Hx of syphilis = no new partners. Rpr negative. Will retest in 4 months. Also do sti, and anal pap at next visit  Health maintenance = TDAP today; otherwise uptodate. Covid booster and flu vaccine recommended for the Fall  Smoking cessation = nearly close to stopping smoking. 1 pack lasting 1 week. Offered chantix but wants to do it on his own. Counseled for 5 min on means of quitting smoking

## 2022-03-26 ENCOUNTER — Other Ambulatory Visit: Payer: 59

## 2022-03-26 ENCOUNTER — Other Ambulatory Visit: Payer: Self-pay

## 2022-03-26 DIAGNOSIS — B20 Human immunodeficiency virus [HIV] disease: Secondary | ICD-10-CM

## 2022-03-28 LAB — T-HELPER CELL (CD4) - (RCID CLINIC ONLY)
CD4 % Helper T Cell: 47 % (ref 33–65)
CD4 T Cell Abs: 816 /uL (ref 400–1790)

## 2022-03-29 LAB — CBC WITH DIFFERENTIAL/PLATELET
Absolute Monocytes: 608 cells/uL (ref 200–950)
Basophils Absolute: 19 cells/uL (ref 0–200)
Basophils Relative: 0.3 %
Eosinophils Absolute: 102 cells/uL (ref 15–500)
Eosinophils Relative: 1.6 %
HCT: 51 % — ABNORMAL HIGH (ref 38.5–50.0)
Hemoglobin: 15.8 g/dL (ref 13.2–17.1)
Lymphs Abs: 1869 cells/uL (ref 850–3900)
MCH: 23.6 pg — ABNORMAL LOW (ref 27.0–33.0)
MCHC: 31 g/dL — ABNORMAL LOW (ref 32.0–36.0)
MCV: 76.1 fL — ABNORMAL LOW (ref 80.0–100.0)
MPV: 10.1 fL (ref 7.5–12.5)
Monocytes Relative: 9.5 %
Neutro Abs: 3802 cells/uL (ref 1500–7800)
Neutrophils Relative %: 59.4 %
Platelets: 293 10*3/uL (ref 140–400)
RBC: 6.7 10*6/uL — ABNORMAL HIGH (ref 4.20–5.80)
RDW: 13 % (ref 11.0–15.0)
Total Lymphocyte: 29.2 %
WBC: 6.4 10*3/uL (ref 3.8–10.8)

## 2022-03-29 LAB — BASIC METABOLIC PANEL
BUN: 16 mg/dL (ref 7–25)
CO2: 29 mmol/L (ref 20–32)
Calcium: 9.6 mg/dL (ref 8.6–10.3)
Chloride: 105 mmol/L (ref 98–110)
Creat: 1.14 mg/dL (ref 0.60–1.26)
Glucose, Bld: 108 mg/dL — ABNORMAL HIGH (ref 65–99)
Potassium: 4.1 mmol/L (ref 3.5–5.3)
Sodium: 141 mmol/L (ref 135–146)

## 2022-03-29 LAB — LIPID PANEL
Cholesterol: 174 mg/dL (ref <200)
HDL: 38 mg/dL — ABNORMAL LOW (ref >=40)
LDL Cholesterol (Calc): 94 mg/dL (calc)
Non-HDL Cholesterol (Calc): 136 mg/dL (calc) — ABNORMAL HIGH (ref <130)
Total CHOL/HDL Ratio: 4.6 (calc) (ref <5.0)
Triglycerides: 313 mg/dL — ABNORMAL HIGH (ref <150)

## 2022-03-29 LAB — RPR: RPR Ser Ql: REACTIVE — AB

## 2022-03-29 LAB — HIV-1 RNA QUANT-NO REFLEX-BLD
HIV 1 RNA Quant: NOT DETECTED Copies/mL
HIV-1 RNA Quant, Log: NOT DETECTED Log cps/mL

## 2022-03-29 LAB — RPR TITER: RPR Titer: 1:1 {titer} — ABNORMAL HIGH

## 2022-03-29 LAB — FLUORESCENT TREPONEMAL AB(FTA)-IGG-BLD: Fluorescent Treponemal ABS: REACTIVE — AB

## 2022-04-07 ENCOUNTER — Ambulatory Visit: Payer: 59 | Admitting: Internal Medicine

## 2022-04-10 ENCOUNTER — Ambulatory Visit (INDEPENDENT_AMBULATORY_CARE_PROVIDER_SITE_OTHER): Payer: 59 | Admitting: Internal Medicine

## 2022-04-10 ENCOUNTER — Other Ambulatory Visit: Payer: Self-pay

## 2022-04-10 DIAGNOSIS — B20 Human immunodeficiency virus [HIV] disease: Secondary | ICD-10-CM

## 2022-04-10 MED ORDER — KETOCONAZOLE 2 % EX CREA: TOPICAL_CREAM | CUTANEOUS | 5 refills | Status: DC

## 2022-04-10 MED ORDER — HIBICLENS 4 % EX LIQD: Freq: Every day | CUTANEOUS | 5 refills | Status: DC | PRN

## 2022-04-10 MED ORDER — CLINDAMYCIN PHOSPHATE 1 % EX GEL
CUTANEOUS | 6 refills | Status: DC
Start: 2022-04-10 — End: 2024-02-17

## 2022-04-10 NOTE — Progress Notes (Signed)
Virtual Visit via Telephone Note  I connected with Ryan Martinez on 04/10/22 at  2:15 PM EDT by telephone and verified that I am speaking with the correct person using two identifiers.  Location: Patient: at work Provider: in clinic   I discussed the limitations, risks, security and privacy concerns of performing an evaluation and management service by telephone and the availability of in person appointments. I also discussed with the patient that there may be a patient responsible charge related to this service. The patient expressed understanding and agreed to proceed.   History of Present Illness: Doing well with odefsey. Does need refills on prescription gel for acne plus chg bath. Also has recurrent tinea skin infection of  feet and uses ketoconazole    Observations/Objective: Fluent speech  Assessment and Plan: Hiv disease= continue with odefsey for well controlled hiv disease  Follow Up Instructions:    I discussed the assessment and treatment plan with the patient. The patient was provided an opportunity to ask questions and all were answered. The patient agreed with the plan and demonstrated an understanding of the instructions.   The patient was advised to call back or seek an in-person evaluation if the symptoms worsen or if the condition fails to improve as anticipated.  I provided 10  minutes of non-face-to-face time during this encounter.   Carlyle Basques, MD

## 2022-05-07 ENCOUNTER — Other Ambulatory Visit: Payer: Self-pay

## 2022-05-07 DIAGNOSIS — B20 Human immunodeficiency virus [HIV] disease: Secondary | ICD-10-CM

## 2022-05-07 MED ORDER — ODEFSEY 200-25-25 MG PO TABS: ORAL_TABLET | ORAL | 0 refills | Status: DC

## 2022-05-29 ENCOUNTER — Other Ambulatory Visit: Payer: Self-pay | Admitting: Internal Medicine

## 2022-05-29 DIAGNOSIS — B20 Human immunodeficiency virus [HIV] disease: Secondary | ICD-10-CM

## 2022-08-14 ENCOUNTER — Encounter: Payer: Self-pay | Admitting: Internal Medicine

## 2022-09-14 ENCOUNTER — Ambulatory Visit (HOSPITAL_COMMUNITY)
Admission: EM | Admit: 2022-09-14 | Discharge: 2022-09-14 | Disposition: A | Discharge status: Home / Self Care | Payer: 59 | Attending: Family Medicine | Admitting: Family Medicine

## 2022-09-14 ENCOUNTER — Encounter (HOSPITAL_COMMUNITY): Payer: Self-pay

## 2022-09-14 DIAGNOSIS — L02412 Cutaneous abscess of left axilla: Secondary | ICD-10-CM

## 2022-09-14 MED ORDER — TRAMADOL HCL 50 MG PO TABS: 50.0000 mg | ORAL_TABLET | Freq: Four times a day (QID) | ORAL | 0 refills | Status: DC | PRN

## 2022-09-14 MED ORDER — PENTAFLUOROPROP-TETRAFLUOROETH EX AERO
INHALATION_SPRAY | CUTANEOUS | Status: AC
  Filled 2022-09-14: qty 30

## 2022-09-14 MED ORDER — AMOXICILLIN-POT CLAVULANATE 875-125 MG PO TABS: 1.0000 | ORAL_TABLET | Freq: Two times a day (BID) | ORAL | 0 refills | Status: AC

## 2022-09-14 MED ORDER — LIDOCAINE-EPINEPHRINE 1 %-1:100000 IJ SOLN
INTRAMUSCULAR | Status: AC
  Filled 2022-09-14: qty 1

## 2022-09-14 NOTE — ED Triage Notes (Signed)
Pt is here for possible abscess under left arm  x 3days . Pt has not been taking any OTC  meds

## 2022-09-14 NOTE — ED Provider Notes (Signed)
[facility]    CSN: [id number] Arrival date & time: 09/14/22  1115      History   Chief Complaint Chief Complaint  Patient presents with   Abscess    HPI Ryan Martinez is a 32 y.o. male.    Abscess  Here for swelling and under his left arm.  Began about 3 days ago and then yesterday it worsened.  No fever or chills.  Morphine causes rash and itching, but I can see the but in epic he has tolerated oxycodone and tramadol in the past.  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 No pertinent surgical history.     Home Medications    Prior to Admission medications   Medication Sig Start Date End Date Taking? Authorizing Provider  clindamycin (CLINDAGEL) 1 % gel APPLY TO AFFECTED AREA TWICE A DAY 04/10/22  Yes Judyann Munson, MD  emtricitabine-rilpivir-tenofovir AF (ODEFSEY) 200-25-25 MG TABS tablet TAKE 1 TABLET BY MOUTH 1 TIME A DAY 05/30/22  Yes Judyann Munson, MD  HIBICLENS 4 % external liquid Apply topically daily as needed. 04/10/22  Yes Judyann Munson, MD  ketoconazole (NIZORAL) 2 % cream Apply to bottoms of feet and between toes twice a day 04/10/22  Yes Judyann Munson, MD    Family History Family History  Problem Relation Age of Onset   Sarcoidosis Mother    Cancer Maternal Aunt     Social History Social History   Tobacco Use   Smoking status: Heavy Smoker    Packs/day: 0.10    Years: 1.00    Additional pack years: 0.00    Total pack years: 0.10    Types: Cigarettes, E-cigarettes   Smokeless tobacco: Never   Tobacco comments:    still working on  quitting  Vaping Use   Vaping Use: Never used  Substance Use Topics   Alcohol use: Yes    Alcohol/week: 1.0 - 2.0 standard drink of alcohol    Types: 1 - 2 Shots of liquor per week    Comment: occ   Drug use: No     Allergies   Morphine and related   Review of Systems Review of Systems   Physical Exam Triage Vital Signs ED Triage Vitals  Enc Vitals Group     BP 09/14/22 1155 134/86     Pulse Rate 09/14/22 1155 (!) 112     Resp 09/14/22 1155 12     Temp 09/14/22 1155 98.9 F (37.2 C)     Temp Source 09/14/22 1155 Oral     SpO2 09/14/22 1155 95 %     Weight --      Height --      Head Circumference --      Peak Flow --      Pain Score 09/14/22 1152 9     Pain Loc --      Pain Edu? --      Excl. in GC? --    No data found.  Updated Vital Signs BP 134/86 (BP Location: Right Arm)   Pulse (!) 112   Temp 98.9 F (  37.2 C) (Oral)   Resp 12   SpO2 95%   Visual Acuity Right Eye Distance:   Left Eye Distance:   Bilateral Distance:    Right Eye Near:   Left Eye Near:    Bilateral Near:     Physical Exam Vitals reviewed.  Constitutional:      General: He is not in acute distress.    Appearance: He is not ill-appearing, toxic-appearing or diaphoretic.  HENT:     Mouth/Throat:     Mouth: Mucous membranes are moist.  Skin:    Comments: There is induration and swelling and tenderness in the left axilla.  It extends approximately 6 cm x 4 cm.  There is fluctuance in the central portion about 1 cm x 3 cm.  Neurological:     Mental Status: He is alert and oriented to person, place, and time.  Psychiatric:        Behavior: Behavior normal.      UC Treatments / Results  Labs (all labs ordered are listed, but only abnormal results are displayed) Labs Reviewed - No data to display  EKG   Radiology No results found.  Procedures Procedures (including critical care time)  Medications Ordered in UC Medications - No data to display  Initial Impression /  Assessment and Plan / UC Course  I have reviewed the triage vital signs and the nursing notes.  Pertinent labs & imaging results that were available during my care of the patient were reviewed by me and considered in my medical decision making (see chart for details).        Procedure note:  After verbal consent was obtained.  Pain spray is applied after alcohol, and then 1% lidocaine with epinephrine is used for infiltration anesthesia; it is placed in the roof of the abscess.  Under clean conditions, #11 blade is used to make a stab wound.  Approximately 3 mL of purulent material was obtained.  Bandages applied and wound care is explained.  Augmentin is sent in to treat the infection, and tramadol is sent in to treat the pain.  I cannot treat with Toradol or oral NSAIDs, as they interact with his Odefsey.  Final Clinical Impressions(s) / UC Diagnoses   Final diagnoses:  None   Discharge Instructions   None    ED Prescriptions   None    PDMP not reviewed this encounter.   Zenia Resides, MD 09/14/22 1245

## 2022-09-14 NOTE — Discharge Instructions (Signed)
Take amoxicillin-clavulanate 875 mg--1 tab twice daily with food for 7 days  Take tramadol 50 mg-- 1 tablet every 6 hours as needed for pain.  This medication can make you sleepy or dizzy  1-2 times daily, clean the wound with peroxide or soapy water and put a clean dry dressing on.

## 2022-11-05 ENCOUNTER — Other Ambulatory Visit: Payer: Self-pay | Admitting: Internal Medicine

## 2022-11-05 DIAGNOSIS — B20 Human immunodeficiency virus [HIV] disease: Secondary | ICD-10-CM

## 2023-01-02 ENCOUNTER — Other Ambulatory Visit: Payer: Self-pay

## 2023-01-02 DIAGNOSIS — Z79899 Other long term (current) drug therapy: Secondary | ICD-10-CM

## 2023-01-02 DIAGNOSIS — B20 Human immunodeficiency virus [HIV] disease: Secondary | ICD-10-CM

## 2023-01-05 ENCOUNTER — Ambulatory Visit: Payer: 59 | Admitting: Internal Medicine

## 2023-01-07 ENCOUNTER — Other Ambulatory Visit: Payer: Federal, State, Local not specified - PPO

## 2023-01-07 ENCOUNTER — Other Ambulatory Visit: Payer: Self-pay

## 2023-01-07 DIAGNOSIS — B20 Human immunodeficiency virus [HIV] disease: Secondary | ICD-10-CM

## 2023-01-07 DIAGNOSIS — Z79899 Other long term (current) drug therapy: Secondary | ICD-10-CM

## 2023-01-07 LAB — CBC WITH DIFFERENTIAL/PLATELET
Absolute Monocytes: 747 cells/uL (ref 200–950)
Basophils Absolute: 17 cells/uL (ref 0–200)
Basophils Relative: 0.3 %
Eosinophils Absolute: 108 cells/uL (ref 15–500)
Eosinophils Relative: 1.9 %
HCT: 50.4 % — ABNORMAL HIGH (ref 38.5–50.0)
Hemoglobin: 15.7 g/dL (ref 13.2–17.1)
Lymphs Abs: 1767 cells/uL (ref 850–3900)
MCH: 23.6 pg — ABNORMAL LOW (ref 27.0–33.0)
MCHC: 31.2 g/dL — ABNORMAL LOW (ref 32.0–36.0)
MCV: 75.7 fL — ABNORMAL LOW (ref 80.0–100.0)
MPV: 9.5 fL (ref 7.5–12.5)
Monocytes Relative: 13.1 %
Neutro Abs: 3061 cells/uL (ref 1500–7800)
Neutrophils Relative %: 53.7 %
Platelets: 275 10*3/uL (ref 140–400)
RBC: 6.66 10*6/uL — ABNORMAL HIGH (ref 4.20–5.80)
RDW: 13.5 % (ref 11.0–15.0)
Total Lymphocyte: 31 %
WBC: 5.7 10*3/uL (ref 3.8–10.8)

## 2023-01-21 ENCOUNTER — Ambulatory Visit: Payer: Federal, State, Local not specified - PPO | Admitting: Internal Medicine

## 2023-01-21 ENCOUNTER — Telehealth: Payer: Self-pay

## 2023-01-21 ENCOUNTER — Other Ambulatory Visit: Payer: Self-pay

## 2023-01-21 ENCOUNTER — Encounter: Payer: Self-pay | Admitting: Internal Medicine

## 2023-01-21 ENCOUNTER — Other Ambulatory Visit (HOSPITAL_COMMUNITY)
Admission: RE | Admit: 2023-01-21 | Discharge: 2023-01-21 | Disposition: A | Discharge status: Home / Self Care | Payer: Federal, State, Local not specified - PPO | Source: Ambulatory Visit | Attending: Internal Medicine | Admitting: Internal Medicine

## 2023-01-21 ENCOUNTER — Other Ambulatory Visit (HOSPITAL_COMMUNITY): Payer: Self-pay

## 2023-01-21 VITALS — BP 130/96 | HR 104 | Temp 98.3°F | Ht 71.0 in | Wt 243.0 lb

## 2023-01-21 DIAGNOSIS — B20 Human immunodeficiency virus [HIV] disease: Secondary | ICD-10-CM | POA: Diagnosis not present

## 2023-01-21 DIAGNOSIS — R030 Elevated blood-pressure reading, without diagnosis of hypertension: Secondary | ICD-10-CM

## 2023-01-21 DIAGNOSIS — Z79899 Other long term (current) drug therapy: Secondary | ICD-10-CM

## 2023-01-21 DIAGNOSIS — Z8619 Personal history of other infectious and parasitic diseases: Secondary | ICD-10-CM | POA: Diagnosis not present

## 2023-01-21 NOTE — Telephone Encounter (Signed)
Pharmacy Patient Advocate Encounter- Cabenuva BIV-Pharmacy Benefit:  PA was submitted to [facility] and has been approved through: 01/21/23-01/21/24 Authorization# [id number]  Please send prescription to Specialty Pharmacy: [facility]: [phone]  Estimated Copay is: $3155.00  Patient [employer] eligible for $0.00 Copay Card. Copay Card can make patient's cost as little as zero. Copay card will be provided to pharmacy.

## 2023-01-21 NOTE — Progress Notes (Signed)
RFV: b20  Patient ID: Ryan Martinez, male   DOB: 07-May-1991, 32 y.o.   MRN: [id number]  HPI 32yo M with hiv disease, on 66. Doing well overall. No health complaint. He is interested with cabenuva injection.    FHx: colon cancer with father -just finished treatment dx at 89yo.  Outpatient Encounter Medications as of 01/21/2023  Medication Sig   clindamycin (CLINDAGEL) 1 % gel APPLY TO AFFECTED AREA TWICE A DAY   HIBICLENS 4 % external liquid Apply topically daily as needed.   ketoconazole (NIZORAL) 2 % cream Apply to bottoms of feet and between toes twice a day   ODEFSEY 200-25-25 MG TABS tablet TAKE 1 TABLET BY MOUTH 1 TIME A DAY   traMADol (ULTRAM) 50 MG tablet Take 1 tablet (50 mg total) by mouth every 6 (six) hours as needed (pain).   No facility-administered encounter medications on file as of 01/21/2023.     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  HENT: Negative for congestion, sore throat, rhinorrhea, sneezing, trouble swallowing and sinus pressure.  Eyes: Negative for photophobia and visual disturbance.  Respiratory: Negative for cough, chest tightness, shortness of breath, wheezing and stridor.  Cardiovascular: Negative for chest pain, palpitations and leg swelling.  Gastrointestinal: Negative for nausea, vomiting, abdominal pain, diarrhea, constipation, blood in stool, abdominal distention and anal bleeding.   Genitourinary: Negative for dysuria, hematuria, flank pain and difficulty urinating.  Musculoskeletal: Negative for myalgias, back pain, joint swelling, arthralgias and gait problem.  Skin: Negative for color change, pallor, rash and wound.  Neurological: Negative for dizziness, tremors, weakness and light-headedness.  Hematological: Negative for adenopathy. Does not bruise/bleed easily.  Psychiatric/Behavioral: Negative for behavioral problems, confusion, sleep disturbance, dysphoric mood, decreased concentration and agitation.   Physical Exam   BP (!) 130/96   Pulse (!) 104   Temp 98.3 F (36.8 C) (Temporal)   Ht 5\' 11"  (1.803 m)   Wt 243 lb (110.2 kg)   SpO2 97%   BMI 33.89 kg/m   Physical Exam  Constitutional: He is oriented to person, place, and time. He appears well-developed and well-nourished. No distress.  HENT:  Mouth/Throat: Oropharynx is clear and moist. No oropharyngeal exudate.  Cardiovascular: Normal rate, regular rhythm and normal heart sounds. Exam reveals no gallop and no friction rub.  No murmur heard.  Pulmonary/Chest: Effort normal and breath sounds normal. No respiratory distress. He has no wheezes.  Abdominal: Soft. Bowel sounds are normal. He exhibits no distension. There is no tenderness.  Lymphadenopathy:  He has no cervical adenopathy.  Neurological: He is alert and oriented to person, place, and time.  Skin: Skin is warm and dry. No rash noted. No erythema.  Psychiatric: He has a normal mood and affect. His behavior is normal.   Lab Results  Component Value Date   CD4TCELL 48 01/07/2023   Lab Results  Component Value Date   CD4TABS 825 01/07/2023   CD4TABS 816 03/26/2022  CD4TABS 580 12/02/2021   Lab Results  Component Value Date   HIV1RNAQUANT <20 (H) 01/07/2023   Lab Results  Component Value Date   HEPBSAB NEG 05/13/2011   Lab Results  Component Value Date   LABRPR REACTIVE (A) 01/07/2023    CBC Lab Results  Component Value  Date   WBC 5.7 01/07/2023   RBC 6.66 (H) 01/07/2023   HGB 15.7 01/07/2023   HCT 50.4 (H) 01/07/2023   PLT 275 01/07/2023   MCV 75.7 (L) 01/07/2023   MCH 23.6 (L) 01/07/2023   MCHC 31.2 (L) 01/07/2023   RDW 13.5 01/07/2023   LYMPHSABS 1,767 01/07/2023   MONOABS 670 06/10/2016   EOSABS 108 01/07/2023    BMET Lab Results  Component Value Date   NA 139 01/07/2023   K 4.1 01/07/2023   CL 103 01/07/2023   CO2 26 01/07/2023   GLUCOSE 108 (H) 01/07/2023   BUN 14 01/07/2023   CREATININE 1.10 01/07/2023   CALCIUM 9.7 01/07/2023   GFRNONAA 92 08/21/2020   GFRAA 106 08/21/2020      Assessment and Plan  Hiv disease= we will have him discuss with pharmacy-the process for starting cabenuva since he has changed insurance.  Long term medication management = cr is stable  Prehypertension = sBP in 130s. Continue to monitor. On repeat his BP is WNL  Hx of syphilis = does not appear to have repeat exposure  Health maintenance = we will do anal pap, and sti screening

## 2023-01-21 NOTE — Telephone Encounter (Signed)
[facility] Patient Advocate Encounter   Received notification from [facility] that prior authorization for Ryan Martinez is required. (Pharmacy Benefits)   PA submitted on 01/21/23 Key [id number] Status is pending    [facility] will continue to follow.   Clearance Coots, CPhT Specialty Pharmacy Patient [facility] Phone: [phone] Fax:  [phone]

## 2023-01-22 ENCOUNTER — Other Ambulatory Visit: Payer: Self-pay

## 2023-01-22 ENCOUNTER — Ambulatory Visit (INDEPENDENT_AMBULATORY_CARE_PROVIDER_SITE_OTHER): Payer: Federal, State, Local not specified - PPO | Admitting: Pharmacist

## 2023-01-22 ENCOUNTER — Other Ambulatory Visit (HOSPITAL_COMMUNITY): Payer: Self-pay

## 2023-01-22 ENCOUNTER — Other Ambulatory Visit: Payer: Self-pay | Admitting: Pharmacist

## 2023-01-22 DIAGNOSIS — B20 Human immunodeficiency virus [HIV] disease: Secondary | ICD-10-CM

## 2023-01-22 LAB — CYTOLOGY, (ORAL, ANAL, URETHRAL) ANCILLARY ONLY
Chlamydia: NEGATIVE
Chlamydia: NEGATIVE
Comment: NEGATIVE
Comment: NORMAL
Comment: NEGATIVE
Comment: NORMAL
Neisseria Gonorrhea: NEGATIVE
Neisseria Gonorrhea: NEGATIVE

## 2023-01-22 LAB — URINE CYTOLOGY ANCILLARY ONLY
Chlamydia: NEGATIVE
Comment: NEGATIVE
Comment: NORMAL
Neisseria Gonorrhea: NEGATIVE

## 2023-01-22 MED ORDER — CABENUVA 600 & 900 MG/3ML IM SUER
1.0000 | INTRAMUSCULAR | 5 refills | Status: DC
Start: 2023-01-22 — End: 2024-02-04
  Filled 2023-01-22 – 2023-04-07 (×2): qty 6, 60d supply, fill #0
  Filled 2023-05-26: qty 6, 60d supply, fill #1
  Filled 2023-07-29: qty 6, 60d supply, fill #2
  Filled 2023-10-05: qty 6, 60d supply, fill #3
  Filled 2023-12-07: qty 6, 60d supply, fill #4

## 2023-01-22 MED ORDER — CABOTEGRAVIR & RILPIVIRINE ER 600 & 900 MG/3ML IM SUER
1.0000 | Freq: Once | INTRAMUSCULAR | Status: AC
Start: 2023-01-22 — End: 2023-01-22
  Administered 2023-01-22: 1 via INTRAMUSCULAR

## 2023-01-22 MED ORDER — CABENUVA 600 & 900 MG/3ML IM SUER
1.0000 | INTRAMUSCULAR | 1 refills | Status: DC
Start: 2023-01-22 — End: 2023-02-17
  Filled 2023-01-22 (×2): qty 6, 30d supply, fill #0
  Filled 2023-01-30: qty 6, 30d supply, fill #1

## 2023-01-22 NOTE — Progress Notes (Signed)
HPI: Ryan Martinez is a 32 y.o. male who presents to the [facility] pharmacy clinic for [place] administration.  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   CABOTEGRAVIR & RILPIVIRINE ER (CABENUVA) 600 & 900 MG/3ML INJECTION    Inject 1 kit into the muscle every 2 (two) months.   CLINDAMYCIN (CLINDAGEL) 1 % GEL    APPLY TO AFFECTED AREA TWICE A DAY   HIBICLENS 4 % EXTERNAL LIQUID    Apply topically daily as needed.   KETOCONAZOLE (NIZORAL) 2 % CREAM    Apply to bottoms of feet and between toes twice a day   ODEFSEY 200-25-25 MG TABS TABLET    TAKE 1 TABLET BY MOUTH 1 TIME A DAY   TRAMADOL (ULTRAM) 50 MG TABLET    Take 1 tablet (50 mg total) by mouth every 6 (six) hours as needed (pain).  Modified Medications   No medications on file  Discontinued Medications   No medications on file    Allergies: Allergies  Allergen Reactions   Morphine And Codeine Itching and Rash    Labs: Lab Results  Component Value Date   HIV1RNAQUANT <20 (H) 01/07/2023   HIV1RNAQUANT Not Detected 03/26/2022   HIV1RNAQUANT <20 (H) 12/02/2021   CD4TABS 825 01/07/2023   CD4TABS 816 03/26/2022   CD4TABS 580 12/02/2021    RPR and STI Lab Results  Component Value Date   LABRPR REACTIVE (A) 01/07/2023   LABRPR REACTIVE (A) 03/26/2022   LABRPR NON-REACTIVE 09/17/2021   LABRPR REACTIVE (A) 06/06/2021   LABRPR REACTIVE (A) 09/03/2020   RPRTITER 1:1 (H) 01/07/2023   RPRTITER 1:1 (H) 03/26/2022   RPRTITER 1:2 (H) 06/06/2021    RPRTITER 1:2 (H) 09/03/2020   RPRTITER 1:1 (H) 03/16/2020    STI Results GC CT  09/17/2021  8:44 AM Negative    Negative    Negative  Negative    Negative    Negative   09/03/2020 10:15 AM Negative    Negative  Negative    Negative   03/16/2020 10:37 AM Negative  Negative   05/03/2019  2:11 PM Negative    Negative  Negative    Negative   05/03/2019 10:16 AM Negative  Negative   12/02/2018 12:00 AM Negative  Negative   04/28/2018 12:00 AM Negative  Negative   07/03/2017 12:00 AM Negative  Negative   03/26/2017 12:00 AM Negative  Negative   06/10/2016 12:00 AM Negative  Negative   10/24/2015 12:00 AM Negative  Negative   05/10/2015 12:00 AM * Negative    * Negative    Negative  * Negative    * Negative    Negative   08/07/2014 12:00 AM NG: Negative  CT: Negative     Hepatitis B Lab Results  Component Value Date   HEPBSAB NEG 05/13/2011   HEPBSAG NEGATIVE 05/13/2011   HEPBCAB NEG 05/13/2011   Hepatitis C No results found for: "HEPCAB", "HCVRNAPCRQN" Hepatitis A Lab Results  Component Value Date   HAV  NEG 05/13/2011   Lipids: Lab Results  Component Value Date   CHOL 174 03/26/2022   TRIG 313 (H) 03/26/2022   HDL 38 (L) 03/26/2022   CHOLHDL 4.6 03/26/2022   VLDL 17 06/10/2016   LDLCALC 94 03/26/2022    Current HIV Regimen: Odefsey  TARGET DATE: The 15th of the month  Assessment: [name] presents today for his first initiation injection for Cabenuva. Counseled that [place] is two separate intramuscular injections in the gluteal muscle on each side for each visit. Explained that the second injection is 30 days after the initial injection then every 2 months thereafter. Discussed the rare but significant chance of developing resistance despite compliance. Explained that showing up to injection appointments is very important and warned that if 2 appointments are missed, it will be reassessed by their provider whether they are a good candidate for injection  therapy. Counseled on possible side effects associated with the injections such as injection site pain, which is usually mild to moderate in nature, injection site nodules, and injection site reactions. Asked to call the clinic or send me a mychart message if they experience any issues, such as fatigue, nausea, headache, rash, or dizziness. Advised that they can take ibuprofen or tylenol for injection site pain if needed.   Administered cabotegravir 600mg /61mL in left upper outer quadrant of the gluteal muscle. Administered rilpivirine 900 mg/30mL in the right upper outer quadrant of the gluteal muscle. Monitored patient for 10 minutes after injection. Injections were tolerated well without issue. Counseled to stop taking Odefsey after today's dose and to call with any issues that may arise. Will make follow up appointments for second initiation injection in 30 days and then maintenance injections every 2 months thereafter.   Plan: - Stop Odefsey after today's dose - First Cabenuva injections administered - Second initiation injection scheduled for 9-10 with Marchelle Folks - Patient requested to schedule maintenance injections at next appointment - Call with any issues or questions  Lennie Muckle, PharmD PGY1 Pharmacy Resident 01/22/2023 11:27 AM

## 2023-01-23 ENCOUNTER — Telehealth: Payer: Self-pay

## 2023-01-23 NOTE — Telephone Encounter (Signed)
[facility] Patient Advocate Encounter  Patient's medication Ryan Martinez) have been couriered to [facility] from [facility] and was administered on the patient office visit on 01/22/23.  Clearance Coots , CPhT Specialty Pharmacy Patient [facility] for Infectious Disease Phone: [phone] Fax:  [phone]

## 2023-01-26 ENCOUNTER — Other Ambulatory Visit: Payer: Self-pay | Admitting: Internal Medicine

## 2023-01-26 DIAGNOSIS — B20 Human immunodeficiency virus [HIV] disease: Secondary | ICD-10-CM

## 2023-01-28 LAB — CYTOLOGY - PAP: Diagnosis: NEGATIVE

## 2023-01-30 ENCOUNTER — Other Ambulatory Visit (HOSPITAL_COMMUNITY): Payer: Self-pay

## 2023-02-06 ENCOUNTER — Other Ambulatory Visit (HOSPITAL_COMMUNITY): Payer: Self-pay

## 2023-02-11 ENCOUNTER — Other Ambulatory Visit (HOSPITAL_COMMUNITY): Payer: Self-pay

## 2023-02-16 ENCOUNTER — Other Ambulatory Visit (HOSPITAL_COMMUNITY): Payer: Self-pay

## 2023-02-16 ENCOUNTER — Telehealth: Payer: Self-pay | Admitting: Pharmacy Technician

## 2023-02-16 NOTE — Telephone Encounter (Signed)
[facility] Patient Advocate Encounter  Patient's medication, Ryan Martinez, has been couriered to [facility] from [facility] and will be administered at pt's next visit  02/17/23.

## 2023-02-17 ENCOUNTER — Other Ambulatory Visit: Payer: Self-pay

## 2023-02-17 ENCOUNTER — Ambulatory Visit (HOSPITAL_COMMUNITY)
Admission: EM | Admit: 2023-02-17 | Discharge: 2023-02-17 | Disposition: A | Discharge status: Home / Self Care | Payer: Federal, State, Local not specified - PPO | Attending: Internal Medicine | Admitting: Internal Medicine

## 2023-02-17 ENCOUNTER — Ambulatory Visit: Payer: Federal, State, Local not specified - PPO | Admitting: Pharmacist

## 2023-02-17 ENCOUNTER — Encounter (HOSPITAL_COMMUNITY): Payer: Self-pay

## 2023-02-17 DIAGNOSIS — T162XXA Foreign body in left ear, initial encounter: Secondary | ICD-10-CM

## 2023-02-17 DIAGNOSIS — B20 Human immunodeficiency virus [HIV] disease: Secondary | ICD-10-CM

## 2023-02-17 DIAGNOSIS — Z23 Encounter for immunization: Secondary | ICD-10-CM | POA: Diagnosis not present

## 2023-02-17 DIAGNOSIS — H6122 Impacted cerumen, left ear: Secondary | ICD-10-CM

## 2023-02-17 MED ORDER — CABOTEGRAVIR & RILPIVIRINE ER 600 & 900 MG/3ML IM SUER
1.0000 | Freq: Once | INTRAMUSCULAR | Status: AC
Start: 2023-02-17 — End: 2023-02-17
  Administered 2023-02-17: 1 via INTRAMUSCULAR

## 2023-02-17 NOTE — Addendum Note (Signed)
Addended by: Jennette Kettle on: 02/17/2023 04:18 PM   Modules accepted: Orders

## 2023-02-17 NOTE — ED Triage Notes (Signed)
Patient here today with c/o left ear pain since last night after what he think was a bug that flew in his ear. Patient states that his ear still feels full and aches on and off.

## 2023-02-17 NOTE — Progress Notes (Signed)
HPI: Ryan Martinez is a 32 y.o. male who presents to the [facility] pharmacy clinic for [place] administration.  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   CLINDAMYCIN (CLINDAGEL) 1 % GEL    APPLY TO AFFECTED AREA TWICE A DAY   HIBICLENS 4 % EXTERNAL LIQUID    Apply topically daily as needed.   KETOCONAZOLE (NIZORAL) 2 % CREAM    Apply to bottoms of feet and between toes twice a day   TRAMADOL (ULTRAM) 50 MG TABLET    Take 1 tablet (50 mg total) by mouth every 6 (six) hours as needed (pain).  Modified Medications   No medications on file  Discontinued Medications   CABOTEGRAVIR & RILPIVIRINE ER (CABENUVA) 600 & 900 MG/3ML INJECTION    Inject 1 kit into the muscle every 30 (thirty) days for 2 doses.   ODEFSEY 200-25-25 MG TABS TABLET    TAKE 1 TABLET BY MOUTH 1 TIME A DAY    Allergies: Allergies  Allergen Reactions   Morphine And Codeine Itching and Rash    Past Medical History: Past Medical History:  Diagnosis Date   HIV (human immunodeficiency virus infection) (HCC)     Social History: Social History   Socioeconomic History   Marital status: Single    Spouse name: Not on file   Number of children: Not on file   Years of education: Not on file   Highest education level: Not on file  Occupational History   Not on file  Tobacco Use   Smoking status: Heavy Smoker    Current packs/day: 0.10    Average packs/day: 0.1 packs/day for 1 year (0.1 ttl pk-yrs)    Types: Cigarettes, E-cigarettes   Smokeless  tobacco: Never   Tobacco comments:    still working on quitting  Vaping Use   Vaping status: Never Used  Substance and Sexual Activity   Alcohol use: Yes    Alcohol/week: 1.0 - 2.0 standard drink of alcohol    Types: 1 - 2 Shots of liquor per week    Comment: occ   Drug use: No   Sexual activity: Not Currently    Birth control/protection: None    Comment: declined condoms 12/2021  Other Topics Concern   Not on file  Social History Narrative   Not on file   Social Determinants of Health   Financial Resource Strain: Not on file  Food Insecurity: No Food Insecurity (09/04/2020)   Received from [facility], [facility]   Hunger Vital Sign    Worried About Running Out of Food in the Last Year: Never true    Ran Out of Food in the Last Year: Never true  Transportation Needs: Not on file  Physical Activity: Not on file  Stress: Not on file  Social Connections: Unknown (10/10/2021)   Received from [facility]   Social Network    Social Network: Not on file    Labs: Lab Results  Component Value Date   HIV1RNAQUANT <20 (  H) 01/07/2023   HIV1RNAQUANT Not Detected 03/26/2022   HIV1RNAQUANT <20 (H) 12/02/2021   CD4TABS 825 01/07/2023   CD4TABS 816 03/26/2022   CD4TABS 580 12/02/2021    RPR and STI Lab Results  Component Value Date   LABRPR REACTIVE (A) 01/07/2023   LABRPR REACTIVE (A) 03/26/2022   LABRPR NON-REACTIVE 09/17/2021   LABRPR REACTIVE (A) 06/06/2021   LABRPR REACTIVE (A) 09/03/2020   RPRTITER 1:1 (H) 01/07/2023   RPRTITER 1:1 (H) 03/26/2022   RPRTITER 1:2 (H) 06/06/2021   RPRTITER 1:2 (H) 09/03/2020   RPRTITER 1:1 (H) 03/16/2020    STI Results GC CT  01/21/2023 11:32 AM Negative    Negative  Negative    Negative   01/21/2023 11:28 AM Negative  Negative   09/17/2021  8:44 AM Negative    Negative    Negative  Negative    Negative    Negative   09/03/2020 10:15 AM Negative    Negative  Negative    Negative   03/16/2020 10:37 AM  Negative  Negative   05/03/2019  2:11 PM Negative    Negative  Negative    Negative   05/03/2019 10:16 AM Negative  Negative   12/02/2018 12:00 AM Negative  Negative   04/28/2018 12:00 AM Negative  Negative   07/03/2017 12:00 AM Negative  Negative   03/26/2017 12:00 AM Negative  Negative   06/10/2016 12:00 AM Negative  Negative   10/24/2015 12:00 AM Negative  Negative   05/10/2015 12:00 AM * Negative    * Negative    Negative  * Negative    * Negative    Negative   08/07/2014 12:00 AM NG: Negative  CT: Negative     Hepatitis B Lab Results  Component Value Date   HEPBSAB NEG 05/13/2011   HEPBSAG NEGATIVE 05/13/2011   HEPBCAB NEG 05/13/2011   Hepatitis C No results found for: "HEPCAB", "HCVRNAPCRQN" Hepatitis A Lab Results  Component Value Date   HAV NEG 05/13/2011   Lipids: Lab Results  Component Value Date   CHOL 174 03/26/2022   TRIG 313 (H) 03/26/2022   HDL 38 (L) 03/26/2022   CHOLHDL 4.6 03/26/2022   VLDL 17 06/10/2016   LDLCALC 94 03/26/2022    TARGET DATE:  The 15th of the month  Current HIV Regimen: Cabenuva  Assessment: Zenon presents today for their maintenance Cabenuva injections. Initial/past injections were tolerated well without issues. No problems with systemic effects of injections. Patient did experience mild soreness x 3-4 days.   Administered cabotegravir 600mg /55mL in left upper outer quadrant of the gluteal muscle. Administered rilpivirine 900 mg/50mL in the right upper outer quadrant of the gluteal muscle. Monitored patient for 10 minutes after injection. Injections were tolerated well without issue. Patient will follow up in 2 months for next injection.   Will check HIV RNA today along with HAV sAb and HBV sAb to reassess HAV/HBV immunity. He politely declines STI testing today and denies any new or recent sexual partners. Due for annual flu vaccine and new COVID vaccine which were both administered along with his next Menveo  booster.  Plan: - Cabenuva injections administered - Check HIV RNA, HAV sAb, and HBV sAb - Administer flu, COVID, and Menveo vaccines - Next injections scheduled for 11/13 with Cassie and 1/8 with Dr. Drue Second  - Call with any issues or questions  Margarite Gouge, PharmD, CPP, BCIDP, AAHIVP Clinical Pharmacist Practitioner Infectious Diseases Clinical Pharmacist [facility]

## 2023-02-17 NOTE — ED Provider Notes (Signed)
[facility]    CSN: [id number] Arrival date & time: 02/17/23  0803      History   Chief Complaint Chief Complaint  Patient presents with   Otalgia    HPI Ryan Martinez is a 32 y.o. male who presents with L ear pain since last night. He felt a bug get in his ear and he applied oil and quit making noise. He tried to reach with his finger nails to get it out, but could not. This am now feels pain in his L ear.    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 No pertinent surgical history.     Home Medications    Prior to Admission medications   Medication Sig Start Date End Date Taking? Authorizing Provider  cabotegravir & rilpivirine ER (CABENUVA) 600 & 900 MG/3ML injection Inject 1 kit into the muscle every 30 (thirty) days for 2 doses. 01/22/23 03/23/23  Kuppelweiser, Cassie L, RPH-CPP  cabotegravir & rilpivirine ER (CABENUVA) 600 & 900 MG/3ML injection Inject 1 kit into the muscle every 2 (two) months. 01/22/23   Kuppelweiser, Cassie L, RPH-CPP  clindamycin (CLINDAGEL) 1 % gel APPLY TO AFFECTED AREA TWICE A DAY 04/10/22   Judyann Munson, MD  HIBICLENS 4 % external liquid Apply topically daily as needed. 04/10/22   Judyann Munson, MD  ketoconazole (NIZORAL) 2 % cream Apply to bottoms of feet and between toes twice a day 04/10/22   Judyann Munson, MD  ODEFSEY 200-25-25 MG TABS tablet TAKE 1 TABLET BY MOUTH 1 TIME A DAY 11/05/22   Judyann Munson, MD  traMADol (ULTRAM) 50 MG tablet Take 1 tablet (50 mg total) by mouth every 6 (six) hours as needed (pain). 09/14/22   Zenia Resides, MD    Family  History Family History  Problem Relation Age of Onset   Sarcoidosis Mother    Cancer Maternal Aunt     Social History Social History   Tobacco Use   Smoking status: Heavy Smoker    Current packs/day: 0.10    Average packs/day: 0.1 packs/day for 1 year (0.1 ttl pk-yrs)    Types: Cigarettes, E-cigarettes   Smokeless tobacco: Never   Tobacco comments:    still working on quitting  Vaping Use   Vaping status: Never Used  Substance Use Topics   Alcohol use: Yes    Alcohol/week: 1.0 - 2.0 standard drink of alcohol    Types: 1 - 2 Shots of liquor per week    Comment: occ   Drug use: No     Allergies   Morphine and codeine   Review of Systems Review of Systems As noted in HPI  Physical Exam Triage Vital Signs ED Triage Vitals  Encounter Vitals Group     BP 02/17/23 0829 130/85     Systolic BP Percentile --      Diastolic BP Percentile --      Pulse Rate 02/17/23 0829 100     Resp 02/17/23 0829 16     Temp 02/17/23 0829 97.9 F (36.6 C)     Temp Source 02/17/23 0829 Oral  SpO2 02/17/23 0829 94 %     Weight 02/17/23 0827 235 lb (106.6 kg)     Height 02/17/23 0827 5\' 10"  (1.778 m)     Head Circumference --      Peak Flow --      Pain Score 02/17/23 0827 4     Pain Loc --      Pain Education --      Exclude from Growth Chart --    No data found.  Updated Vital Signs BP 130/85 (BP Location: Left Arm)   Pulse 100   Temp 97.9 F (36.6 C) (Oral)   Resp 16   Ht 5\' 10"  (1.778 m)   Wt 235 lb (106.6 kg)   SpO2 94%   BMI 33.72 kg/m   Visual Acuity Right Eye Distance:   Left Eye Distance:   Bilateral Distance:    Right Eye Near:   Left Eye Near:    Bilateral Near:     Physical Exam Vitals and nursing note reviewed.  Constitutional:      General: He is not in acute distress. HENT:     Head: Normocephalic.     Right Ear: External ear normal.     Left Ear: External ear normal. No tenderness. There is impacted cerumen. A foreign body is present.      Ears:     Comments: No abrasions noted in canal Eyes:     General: No scleral icterus.    Conjunctiva/sclera: Conjunctivae normal.  Pulmonary:     Effort: Pulmonary effort is normal.  Musculoskeletal:        General: Normal range of motion.     Cervical back: Neck supple.  Skin:    General: Skin is warm and dry.  Neurological:     Mental Status: He is alert and oriented to person, place, and time.     Gait: Gait normal.  Psychiatric:        Mood and Affect: Mood normal.        Behavior: Behavior normal.        Thought Content: Thought content normal.        Judgment: Judgment normal.      UC Treatments / Results  Labs (all labs ordered are listed, but only abnormal results are displayed) Labs Reviewed - No data to display  EKG   Radiology No results found.  Procedures Procedures (including critical care time)  Medications Ordered in UC Medications - No data to display  Initial Impression / Assessment and Plan / UC Course  I have reviewed the triage vital signs and the nursing notes. L ear lavage done and the FB looked like a moth. The was was not able to be flushed. Pt denies any ear pain with external ear motion after lavage or pain at all in the L ear.    Final Clinical Impressions(s) / UC Diagnoses   Final diagnoses:  Foreign body of left ear, initial encounter  Hearing loss due to cerumen impaction, left   Discharge Instructions   None    ED Prescriptions   None    PDMP not reviewed this encounter.   Garey Ham, [place] 02/17/23 [phone]

## 2023-02-19 LAB — HEPATITIS B SURFACE ANTIBODY,QUALITATIVE: Hep B S Ab: REACTIVE — AB

## 2023-02-19 LAB — HEPATITIS A ANTIBODY, TOTAL: Hepatitis A AB,Total: REACTIVE — AB

## 2023-02-19 LAB — HIV-1 RNA QUANT-NO REFLEX-BLD
HIV 1 RNA Quant: <20 {copies}/mL — ABNORMAL HIGH
HIV-1 RNA Quant, Log: <1.3 {Log_copies}/mL — ABNORMAL HIGH

## 2023-04-07 ENCOUNTER — Other Ambulatory Visit: Payer: Self-pay

## 2023-04-07 ENCOUNTER — Other Ambulatory Visit (HOSPITAL_COMMUNITY): Payer: Self-pay

## 2023-04-07 NOTE — Progress Notes (Signed)
Specialty Pharmacy Refill Coordination Note  Ryan Martinez is a 32 y.o. male assessed today regarding refills of clinic administered specialty medication(s) Cabotegravir & Rilpivirine   Clinic requested Courier to Provider Office   Delivery date: 04/16/23   Verified address: [facility] [address] [place] [address]   Medication will be filled on 04/15/23.

## 2023-04-15 ENCOUNTER — Other Ambulatory Visit: Payer: Self-pay

## 2023-04-16 ENCOUNTER — Telehealth: Payer: Self-pay

## 2023-04-16 NOTE — Telephone Encounter (Signed)
[facility] Patient Advocate Encounter  Patient's medications CABENUVA have been couriered to [facility] from [facility] and will be administered at the patients appointment on 04/22/23.  Kae Heller, CPhT Specialty Pharmacy Patient [facility] for Infectious Disease Phone: [phone] Fax:  [phone]

## 2023-04-22 ENCOUNTER — Ambulatory Visit: Payer: Federal, State, Local not specified - PPO | Admitting: Internal Medicine

## 2023-04-22 ENCOUNTER — Encounter: Payer: Self-pay | Admitting: Internal Medicine

## 2023-04-22 ENCOUNTER — Other Ambulatory Visit: Payer: Self-pay

## 2023-04-22 VITALS — BP 141/106 | HR 83 | Resp 16 | Ht 70.0 in | Wt 244.0 lb

## 2023-04-22 DIAGNOSIS — B20 Human immunodeficiency virus [HIV] disease: Secondary | ICD-10-CM

## 2023-04-22 MED ORDER — CABOTEGRAVIR & RILPIVIRINE ER 600 & 900 MG/3ML IM SUER
1.0000 | Freq: Once | INTRAMUSCULAR | Status: AC
Start: 2023-04-22 — End: 2023-04-22
  Administered 2023-04-22: 1 via INTRAMUSCULAR

## 2023-04-22 NOTE — Progress Notes (Unsigned)
RFV: follow up for hiv disease  Patient ID: Ryan Martinez, male   DOB: [birth year], 32 y.o.   MRN: [id number]  HPI 32yo M with well controlled hiv disease. On cabenuva. The patient reports in his good health. No issues. Alittle worried about his bp reading during the visit.  Outpatient Encounter Medications as of 04/22/2023  Medication Sig   cabotegravir & rilpivirine ER (CABENUVA) 600 & 900 MG/3ML injection Inject 1 kit into the muscle every 2 (two) months.   clindamycin (CLINDAGEL) 1 % gel APPLY TO AFFECTED AREA TWICE A DAY   HIBICLENS 4 % external liquid Apply topically daily as needed.   ketoconazole (NIZORAL) 2 % cream Apply to bottoms of feet and between toes twice a day   traMADol (ULTRAM) 50 MG tablet Take 1 tablet (50 mg total) by mouth every 6 (six) hours as needed (pain).   No facility-administered encounter medications on file as of 04/22/2023.     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 He appears well-developed and well-nourished. No distress.  HENT:  Mouth/Throat: Oropharynx is clear and moist. No oropharyngeal exudate.  Cardiovascular: Normal rate, regular rhythm and normal heart sounds. Exam reveals no gallop and no friction rub.  No murmur heard.  Pulmonary/Chest: Effort normal and breath sounds normal. No respiratory distress. He has no wheezes.  Abdominal: Soft. Bowel sounds are normal. He exhibits no distension. There is no tenderness.  Lymphadenopathy:  He has no cervical adenopathy.  Neurological: He is  alert and oriented to person, place, and time.  Skin: Skin is warm and dry. No rash noted. No erythema.  Psychiatric: He has a normal mood and affect. His behavior is normal.   Physical Exam   BP (!) 141/106   Pulse 83   Resp 16   Ht 5\' 10"  (1.778 m)   Wt 244 lb (110.7 kg)   BMI 35.01 kg/m   Physical Exam  Constitutional: He is oriented to person, place, and time. He appears well-developed and well-nourished. No distress.  HENT:  Mouth/Throat: Oropharynx is clear and moist. No oropharyngeal exudate.  Cardiovascular: Normal rate, regular rhythm and normal heart sounds. Exam reveals no gallop and no friction rub.  No murmur heard.  Pulmonary/Chest: Effort normal and breath sounds normal. No respiratory distress. He has no wheezes.  Lymphadenopathy:  He has no cervical adenopathy.  Neurological: He is alert and oriented to person, place, and time.  Skin: Skin is warm and dry. No rash noted. No erythema.  Psychiatric: He has a normal mood and affect. His behavior is normal.   Lab Results  Component Value Date   CD4TCELL 48 01/07/2023   Lab Results  Component Value Date   CD4TABS 825 01/07/2023   CD4TABS 816 03/26/2022   CD4TABS 580 12/02/2021   Lab Results  Component Value Date   HIV1RNAQUANT <20 (H) 02/17/2023   Lab Results  Component Value Date   HEPBSAB REACTIVE (A) 02/17/2023   Lab Results  Component Value Date   LABRPR REACTIVE (A) 01/07/2023  CBC Lab Results  Component Value Date   WBC 5.7 01/07/2023   RBC 6.66 (H) 01/07/2023   HGB 15.7 01/07/2023   HCT 50.4 (H) 01/07/2023   PLT 275 01/07/2023   MCV 75.7 (L) 01/07/2023   MCH 23.6 (L) 01/07/2023   MCHC 31.2 (L) 01/07/2023   RDW 13.5 01/07/2023   LYMPHSABS 1,767 01/07/2023   MONOABS 670 06/10/2016   EOSABS 108 01/07/2023    BMET Lab Results  Component Value Date   NA 139 01/07/2023   K 4.1 01/07/2023   CL 103 01/07/2023   CO2 26 01/07/2023   GLUCOSE 108 (H) 01/07/2023   BUN 14 01/07/2023    CREATININE 1.10 01/07/2023   CALCIUM 9.7 01/07/2023   GFRNONAA 92 08/21/2020   GFRAA 106 08/21/2020      Assessment and Plan Hiv disease = will plan to give cabenuva and do labs at next injection  Long term medication management = cr is stable  Pre hypertension = rechecked iand sBP n the 130s   Uptodate on vaccines

## 2023-04-23 ENCOUNTER — Other Ambulatory Visit (HOSPITAL_COMMUNITY): Payer: Self-pay

## 2023-05-26 ENCOUNTER — Other Ambulatory Visit: Payer: Self-pay

## 2023-05-26 ENCOUNTER — Other Ambulatory Visit (HOSPITAL_COMMUNITY): Payer: Self-pay

## 2023-05-26 NOTE — Progress Notes (Signed)
Specialty Pharmacy Refill Coordination Note  JAC BOOTON is a 32 y.o. male assessed today regarding refills of clinic administered specialty medication(s) Cabotegravir & Rilpivirine [name])   Clinic requested Courier to Provider Office   Delivery date: 06/09/23   Verified address: [address] [place] [address]   Medication will be filled on 06/08/23.

## 2023-06-08 ENCOUNTER — Other Ambulatory Visit: Payer: Self-pay

## 2023-06-09 ENCOUNTER — Telehealth: Payer: Self-pay

## 2023-06-09 NOTE — Telephone Encounter (Signed)
 [facility] Patient Advocate Encounter  Patient's medications CABENUVA  have been couriered to [facility] from [facility] and will be administered at the patients appointment on 06/17/23.  Charmaine Sharps, CPhT Specialty Pharmacy Patient [facility] for Infectious Disease Phone: [phone] Fax:  [phone]

## 2023-06-11 ENCOUNTER — Other Ambulatory Visit (HOSPITAL_COMMUNITY): Payer: Self-pay

## 2023-06-12 ENCOUNTER — Other Ambulatory Visit (HOSPITAL_COMMUNITY): Payer: Self-pay

## 2023-06-16 NOTE — Progress Notes (Signed)
 HPI: Ryan Martinez is a 33 y.o. male who presents to the [facility] pharmacy clinic for Cabenuva  administration.  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   CLINDAMYCIN  (CLINDAGEL) 1 % GEL    APPLY TO AFFECTED AREA TWICE A DAY   HIBICLENS  4 % EXTERNAL LIQUID    Apply topically daily as needed.   KETOCONAZOLE  (NIZORAL ) 2 % CREAM    Apply to bottoms of feet and between toes twice a day   TRAMADOL  (ULTRAM ) 50 MG TABLET    Take 1 tablet (50 mg total) by mouth every 6 (six) hours as needed (pain).  Modified Medications   No medications on file  Discontinued Medications   No medications on file    Allergies: Allergies  Allergen Reactions   Morphine And Codeine Itching and Rash    Labs: Lab Results  Component Value Date   HIV1RNAQUANT <20 (H) 02/17/2023   HIV1RNAQUANT <20 (H) 01/07/2023   HIV1RNAQUANT Not Detected 03/26/2022   CD4TABS 825 01/07/2023   CD4TABS 816 03/26/2022   CD4TABS 580 12/02/2021    RPR and STI Lab Results  Component Value Date   LABRPR REACTIVE (A) 01/07/2023   LABRPR REACTIVE (A) 03/26/2022   LABRPR NON-REACTIVE 09/17/2021   LABRPR REACTIVE (A) 06/06/2021   LABRPR REACTIVE (A) 09/03/2020   RPRTITER 1:1 (H) 01/07/2023   RPRTITER 1:1 (H) 03/26/2022   RPRTITER 1:2 (H) 06/06/2021   RPRTITER 1:2 (H) 09/03/2020   RPRTITER 1:1 (H) 03/16/2020    STI Results GC CT  01/21/2023 11:32 AM Negative    Negative  Negative    Negative   01/21/2023 11:28 AM Negative  Negative   09/17/2021  8:44  AM Negative    Negative    Negative  Negative    Negative    Negative   09/03/2020 10:15 AM Negative    Negative  Negative    Negative   03/16/2020 10:37 AM Negative  Negative   05/03/2019  2:11 PM Negative    Negative  Negative    Negative   05/03/2019 10:16 AM Negative  Negative   12/02/2018 12:00 AM Negative  Negative   04/28/2018 12:00 AM Negative  Negative   07/03/2017 12:00 AM Negative  Negative   03/26/2017 12:00 AM Negative  Negative   06/10/2016 12:00 AM Negative  Negative   10/24/2015 12:00 AM Negative  Negative   05/10/2015 12:00 AM * Negative    * Negative    Negative  * Negative    * Negative    Negative   08/07/2014 12:00 AM NG: Negative  CT: Negative     Hepatitis B Lab Results  Component Value Date   HEPBSAB REACTIVE (A) 02/17/2023   HEPBSAG NEGATIVE 05/13/2011   HEPBCAB NEG 05/13/2011   Hepatitis C No results found for: HEPCAB, HCVRNAPCRQN Hepatitis A Lab Results  Component Value Date   HAV REACTIVE (A) 02/17/2023   Lipids: Lab Results  Component Value Date   CHOL 174 03/26/2022   TRIG 313 (H)  03/26/2022   HDL 38 (L) 03/26/2022   CHOLHDL 4.6 03/26/2022   VLDL 17 06/10/2016   LDLCALC 94 03/26/2022    TARGET DATE: The 15th  Assessment: Ryan Martinez presents today for his maintenance Cabenuva  injections. Past injections were tolerated well without issues. Last HIV RNA was undetectable on 02/17/23. Will update all lab work today per Dr. Luiz. He states that his blood pressure has been running high at home - in the 150s/160s. He wishes to start a medication to control this. Spoke with Dr. Luiz and will send in hydrochlorothiazide  25 mg once daily. He does not have any concerns for STIs today but agrees to full testing.  Administered cabotegravir  600mg /62mL in left upper outer quadrant of the gluteal muscle. Administered rilpivirine  900 mg/3mL in the right upper outer quadrant of the gluteal muscle. No issues with injections. He will follow up  in 2 months for next set of injections.  Plan: - Cabenuva  injections administered - HIV RNA, RPR, lipid panel, CMP, CBC with diff, and oral/rectal/urine cytologies - Next injections scheduled for 08/19/23 with Dr. Luiz and 10/21/23 with me - Call with any issues or questions  Ryan Martinez, PharmD, BCIDP, AAHIVP, CPP Clinical Pharmacist Practitioner Infectious Diseases Clinical Pharmacist [facility]

## 2023-06-17 ENCOUNTER — Encounter: Payer: Self-pay | Admitting: Pharmacist

## 2023-06-17 ENCOUNTER — Other Ambulatory Visit: Payer: Self-pay

## 2023-06-17 ENCOUNTER — Ambulatory Visit (INDEPENDENT_AMBULATORY_CARE_PROVIDER_SITE_OTHER): Payer: BC Managed Care – PPO | Admitting: Pharmacist

## 2023-06-17 DIAGNOSIS — B20 Human immunodeficiency virus [HIV] disease: Secondary | ICD-10-CM | POA: Diagnosis not present

## 2023-06-17 DIAGNOSIS — Z113 Encounter for screening for infections with a predominantly sexual mode of transmission: Secondary | ICD-10-CM

## 2023-06-17 DIAGNOSIS — Z79899 Other long term (current) drug therapy: Secondary | ICD-10-CM

## 2023-06-17 DIAGNOSIS — R030 Elevated blood-pressure reading, without diagnosis of hypertension: Secondary | ICD-10-CM

## 2023-06-17 MED ORDER — CABOTEGRAVIR & RILPIVIRINE ER 600 & 900 MG/3ML IM SUER
1.0000 | Freq: Once | INTRAMUSCULAR | Status: AC
  Administered 2023-06-17: 1 via INTRAMUSCULAR

## 2023-06-17 MED ORDER — HYDROCHLOROTHIAZIDE 25 MG PO TABS: 25.0000 mg | ORAL_TABLET | Freq: Every day | ORAL | 11 refills | Status: DC

## 2023-06-18 LAB — C. TRACHOMATIS/N. GONORRHOEAE RNA
C. trachomatis RNA, TMA: NOT DETECTED
N. gonorrhoeae RNA, TMA: NOT DETECTED

## 2023-06-18 LAB — GC/CHLAMYDIA PROBE, AMP (THROAT)
Chlamydia trachomatis RNA: NOT DETECTED
Neisseria gonorrhoeae RNA: NOT DETECTED

## 2023-06-18 LAB — CT/NG RNA, TMA RECTAL
Chlamydia Trachomatis RNA: NOT DETECTED
Neisseria Gonorrhoeae RNA: NOT DETECTED

## 2023-06-23 LAB — LIPID PANEL
Cholesterol: 208 mg/dL — ABNORMAL HIGH (ref <200)
HDL: 53 mg/dL (ref >=40)
LDL Cholesterol (Calc): 138 mg/dL — ABNORMAL HIGH
Non-HDL Cholesterol (Calc): 155 mg/dL — ABNORMAL HIGH (ref <130)
Total CHOL/HDL Ratio: 3.9 (calc) (ref <5.0)
Triglycerides: 75 mg/dL (ref <150)

## 2023-06-23 LAB — CBC WITH DIFFERENTIAL/PLATELET
Absolute Lymphocytes: 1864 {cells}/uL (ref 850–3900)
Absolute Monocytes: 696 {cells}/uL (ref 200–950)
Basophils Absolute: 30 {cells}/uL (ref 0–200)
Basophils Relative: 0.5 %
Eosinophils Absolute: 171 {cells}/uL (ref 15–500)
Eosinophils Relative: 2.9 %
HCT: 49.6 % (ref 38.5–50.0)
Hemoglobin: 15.6 g/dL (ref 13.2–17.1)
MCH: 23.1 pg — ABNORMAL LOW (ref 27.0–33.0)
MCHC: 31.5 g/dL — ABNORMAL LOW (ref 32.0–36.0)
MCV: 73.4 fL — ABNORMAL LOW (ref 80.0–100.0)
MPV: 9.6 fL (ref 7.5–12.5)
Monocytes Relative: 11.8 %
Neutro Abs: 3139 {cells}/uL (ref 1500–7800)
Neutrophils Relative %: 53.2 %
Platelets: 281 10*3/uL (ref 140–400)
RBC: 6.76 10*6/uL — ABNORMAL HIGH (ref 4.20–5.80)
RDW: 13.5 % (ref 11.0–15.0)
Total Lymphocyte: 31.6 %
WBC: 5.9 10*3/uL (ref 3.8–10.8)

## 2023-06-23 LAB — COMPREHENSIVE METABOLIC PANEL
AG Ratio: 1.6 (calc) (ref 1.0–2.5)
ALT: 20 U/L (ref 9–46)
AST: 15 U/L (ref 10–40)
Albumin: 4.6 g/dL (ref 3.6–5.1)
Alkaline phosphatase (APISO): 63 U/L (ref 36–130)
BUN: 12 mg/dL (ref 7–25)
CO2: 27 mmol/L (ref 20–32)
Calcium: 9.6 mg/dL (ref 8.6–10.3)
Chloride: 104 mmol/L (ref 98–110)
Creat: 0.86 mg/dL (ref 0.60–1.26)
Globulin: 2.8 g/dL (ref 1.9–3.7)
Glucose, Bld: 98 mg/dL (ref 65–99)
Potassium: 4 mmol/L (ref 3.5–5.3)
Sodium: 140 mmol/L (ref 135–146)
Total Bilirubin: 0.9 mg/dL (ref 0.2–1.2)
Total Protein: 7.4 g/dL (ref 6.1–8.1)

## 2023-06-23 LAB — T PALLIDUM AB: T Pallidum Abs: POSITIVE — AB

## 2023-06-23 LAB — RPR: RPR Ser Ql: REACTIVE — AB

## 2023-06-23 LAB — HIV-1 RNA QUANT-NO REFLEX-BLD
HIV 1 RNA Quant: NOT DETECTED {copies}/mL
HIV-1 RNA Quant, Log: NOT DETECTED {Log_copies}/mL

## 2023-06-23 LAB — RPR TITER: RPR Titer: 1:2 {titer} — ABNORMAL HIGH

## 2023-07-29 ENCOUNTER — Other Ambulatory Visit (HOSPITAL_COMMUNITY): Payer: Self-pay

## 2023-07-29 ENCOUNTER — Other Ambulatory Visit: Payer: Self-pay

## 2023-07-29 NOTE — Progress Notes (Signed)
Specialty Pharmacy Refill Coordination Note  Ryan Martinez is a 33 y.o. male assessed today regarding refills of clinic administered specialty medication(s) Cabotegravir & Rilpivirine [name])   Clinic requested Courier to Provider Office   Delivery date: 08/12/23   Verified address: [address] [place] [address]   Medication will be filled on 08/11/23.

## 2023-08-12 ENCOUNTER — Telehealth: Payer: Self-pay

## 2023-08-12 NOTE — Telephone Encounter (Signed)
 [facility] Patient Advocate Encounter  Patient's medications CABENUVA have been couriered to [facility] from [facility] and will be administered at the patients appointment on 08/19/23.  Kae Heller, CPhT Specialty Pharmacy Patient [facility] for Infectious Disease Phone: [phone] Fax:  [phone]

## 2023-08-19 ENCOUNTER — Encounter: Payer: Self-pay | Admitting: Internal Medicine

## 2023-08-19 ENCOUNTER — Ambulatory Visit (INDEPENDENT_AMBULATORY_CARE_PROVIDER_SITE_OTHER): Payer: Federal, State, Local not specified - PPO | Admitting: Internal Medicine

## 2023-08-19 ENCOUNTER — Other Ambulatory Visit: Payer: Self-pay

## 2023-08-19 VITALS — BP 131/88 | HR 92 | Temp 97.4°F | Ht 70.0 in | Wt 252.0 lb

## 2023-08-19 DIAGNOSIS — Z79899 Other long term (current) drug therapy: Secondary | ICD-10-CM

## 2023-08-19 DIAGNOSIS — E785 Hyperlipidemia, unspecified: Secondary | ICD-10-CM

## 2023-08-19 DIAGNOSIS — B20 Human immunodeficiency virus [HIV] disease: Secondary | ICD-10-CM

## 2023-08-19 MED ORDER — CABOTEGRAVIR & RILPIVIRINE ER 600 & 900 MG/3ML IM SUER
1.0000 | Freq: Once | INTRAMUSCULAR | Status: AC
Start: 2023-08-19 — End: 2023-08-19
  Administered 2023-08-19: 1 via INTRAMUSCULAR

## 2023-08-19 NOTE — Progress Notes (Signed)
 Patient ID: Ryan Martinez, male   DOB: 1990-09-26, 33 y.o.   MRN: [id number]  HPI Ryan Martinez is a 33yo M with well controlled hiv disease, currently on Q2 month cabenuva. Doing well overall. He reports that he has recovered from recent  respiratory viruses - now improved. No fever, no flu like illnesses  Outpatient Encounter Medications as of 08/19/2023  Medication Sig   cabotegravir & rilpivirine ER (CABENUVA) 600 & 900 MG/3ML injection Inject 1 kit into the muscle every 2 (two) months.   clindamycin (CLINDAGEL) 1 % gel APPLY TO AFFECTED AREA TWICE A DAY   HIBICLENS 4 % external liquid Apply topically daily as needed.   hydrochlorothiazide (HYDRODIURIL) 25 MG tablet Take 1 tablet (25 mg total) by mouth daily.   ketoconazole (NIZORAL) 2 % cream Apply to bottoms of feet and between toes twice a day   traMADol (ULTRAM) 50 MG tablet Take 1 tablet (50 mg total) by mouth every 6 (six) hours as needed (pain).   [EXPIRED] cabotegravir & rilpivirine ER (CABENUVA) 600 & 900 MG/3ML injection 1 kit    No facility-administered encounter medications on file as of 08/19/2023.     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  HENT: Negative for congestion, sore throat, rhinorrhea, sneezing, trouble swallowing and sinus pressure.  Eyes: Negative for photophobia and visual disturbance.  Respiratory: Negative for cough, chest tightness, shortness of breath, wheezing and  stridor.  Cardiovascular: Negative for chest pain, palpitations and leg swelling.  Gastrointestinal: Negative for nausea, vomiting, abdominal pain, diarrhea, constipation, blood in stool, abdominal distention and anal bleeding.  Genitourinary: Negative for dysuria, hematuria, flank pain and difficulty urinating.  Musculoskeletal: Negative for myalgias, back pain, joint swelling, arthralgias and gait problem.  Skin: Negative for color change, pallor, rash and wound.  Neurological: Negative for dizziness, tremors, weakness and light-headedness.  Hematological: Negative for adenopathy. Does not bruise/bleed easily.  Psychiatric/Behavioral: Negative for behavioral problems, confusion, sleep disturbance, dysphoric mood, decreased concentration and agitation.   Physical Exam   BP 131/88   Pulse 92   Temp (!) 97.4 F (36.3 C) (Oral)   Ht 5\' 10"  (1.778 m)   Wt 252 lb (114.3 kg)   SpO2 97%   BMI 36.16 kg/m   Physical Exam  Constitutional: He is oriented to person, place, and time. He appears well-developed and well-nourished. No distress.  HENT:  Mouth/Throat: Oropharynx is clear and moist. No oropharyngeal exudate.  Cardiovascular: Normal rate, regular rhythm and normal heart sounds. Exam reveals no gallop and no friction rub.  No murmur heard.  Pulmonary/Chest: Effort normal and breath sounds normal. No respiratory distress. He has no wheezes.  Abdominal: Soft. Bowel sounds are normal. He exhibits no distension. There is no tenderness.  Lymphadenopathy:  He has no cervical adenopathy.  Neurological: He is alert and oriented to person, place, and time.  Skin: Skin is warm and dry. No rash noted. No erythema.  Psychiatric: He has a normal mood and  affect. His behavior is normal.   Lab Results  Component Value Date   CD4TCELL 48 01/07/2023   Lab Results  Component Value Date   CD4TABS 825 01/07/2023   CD4TABS 816 03/26/2022   CD4TABS 580 12/02/2021   Lab Results  Component Value  Date   HIV1RNAQUANT Not Detected 06/17/2023   Lab Results  Component Value Date   HEPBSAB REACTIVE (A) 02/17/2023   Lab Results  Component Value Date   LABRPR REACTIVE (A) 06/17/2023    CBC Lab Results  Component Value Date   WBC 5.9 06/17/2023   RBC 6.76 (H) 06/17/2023   HGB 15.6 06/17/2023   HCT 49.6 06/17/2023   PLT 281 06/17/2023   MCV 73.4 (L) 06/17/2023   MCH 23.1 (L) 06/17/2023   MCHC 31.5 (L) 06/17/2023   RDW 13.5 06/17/2023   LYMPHSABS 1,767 01/07/2023   MONOABS 670 06/10/2016   EOSABS 171 06/17/2023    BMET Lab Results  Component Value Date   NA 140 06/17/2023   K 4.0 06/17/2023   CL 104 06/17/2023   CO2 27 06/17/2023   GLUCOSE 98 06/17/2023   BUN 12 06/17/2023   CREATININE 0.86 06/17/2023   CALCIUM 9.6 06/17/2023   GFRNONAA 92 08/21/2020   GFRAA 106 08/21/2020      Assessment and Plan  HIV disease = labs in January shows that he is well controlled.check labs at next appt  Long term medication management =cr has been stable. Plan to recheck at next appt  Dyslipidemia = will recheck in 6 months. Recommend diet modification  Rtc in 2 months

## 2023-10-05 ENCOUNTER — Other Ambulatory Visit: Payer: Self-pay

## 2023-10-05 ENCOUNTER — Other Ambulatory Visit (HOSPITAL_COMMUNITY): Payer: Self-pay

## 2023-10-05 NOTE — Progress Notes (Signed)
 Specialty Pharmacy Refill Coordination Note  DAVIDMICHAEL SPRINGS is a 33 y.o. male assessed today regarding refills of clinic administered specialty medication(s) Cabotegravir  & Rilpivirine  (Cabenuva )   Clinic requested Courier to Provider Office   Delivery date: 10/12/23   Verified address: [address] [place] [address]   Medication will be filled on 10/09/23.

## 2023-10-09 ENCOUNTER — Other Ambulatory Visit: Payer: Self-pay

## 2023-10-12 ENCOUNTER — Telehealth: Payer: Self-pay

## 2023-10-12 NOTE — Telephone Encounter (Signed)
 [facility] Patient Advocate Encounter  Patient's medications Cabenuva  have been couriered to [facility] from [facility] and will be administered at the patients appointment on 10/21/23.  Roylene Corn, CPhT Specialty Pharmacy Patient [facility] for Infectious Disease Phone: [phone] Fax:  [phone]

## 2023-10-20 NOTE — Progress Notes (Unsigned)
 HPI: Ryan Martinez is a 33 y.o. male who presents to the [facility] pharmacy clinic for Cabenuva  administration.  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   CLINDAMYCIN  (CLINDAGEL) 1 % GEL    APPLY TO AFFECTED AREA TWICE A DAY   HIBICLENS  4 % EXTERNAL LIQUID    Apply topically daily as needed.   HYDROCHLOROTHIAZIDE  (HYDRODIURIL ) 25 MG TABLET    Take 1 tablet (25 mg total) by mouth daily.   KETOCONAZOLE  (NIZORAL ) 2 % CREAM    Apply to bottoms of feet and between toes twice a day   TRAMADOL  (ULTRAM ) 50 MG TABLET    Take 1 tablet (50 mg total) by mouth every 6 (six) hours as needed (pain).  Modified Medications   No medications on file  Discontinued Medications   No medications on file    Allergies: Allergies  Allergen Reactions   Morphine And Codeine Itching and Rash    Labs: Lab Results  Component Value Date   HIV1RNAQUANT Not Detected 06/17/2023   HIV1RNAQUANT <20 (H) 02/17/2023   HIV1RNAQUANT <20 (H) 01/07/2023   CD4TABS 825 01/07/2023   CD4TABS 816 03/26/2022   CD4TABS 580 12/02/2021    RPR and STI Lab Results  Component Value Date   LABRPR REACTIVE (A) 06/17/2023   LABRPR REACTIVE (A) 01/07/2023   LABRPR REACTIVE (A) 03/26/2022   LABRPR NON-REACTIVE 09/17/2021   LABRPR REACTIVE (A) 06/06/2021   RPRTITER 1:2 (H) 06/17/2023   RPRTITER 1:1 (H) 01/07/2023   RPRTITER 1:1 (H) 03/26/2022   RPRTITER 1:2 (H) 06/06/2021   RPRTITER 1:2 (H) 09/03/2020    STI Results GC CT  01/21/2023 11:32 AM Negative     Negative  Negative    Negative   01/21/2023 11:28 AM Negative  Negative   09/17/2021  8:44 AM Negative    Negative    Negative  Negative    Negative    Negative   09/03/2020 10:15 AM Negative    Negative  Negative    Negative   03/16/2020 10:37 AM Negative  Negative   05/03/2019  2:11 PM Negative    Negative  Negative    Negative   05/03/2019 10:16 AM Negative  Negative   12/02/2018 12:00 AM Negative  Negative   04/28/2018 12:00 AM Negative  Negative   07/03/2017 12:00 AM Negative  Negative   03/26/2017 12:00 AM Negative  Negative   06/10/2016 12:00 AM Negative  Negative   10/24/2015 12:00 AM Negative  Negative   05/10/2015 12:00 AM * Negative    * Negative    Negative  * Negative    * Negative    Negative   08/07/2014 12:00 AM NG: Negative  CT: Negative     Hepatitis B Lab Results  Component Value Date   HEPBSAB REACTIVE (A) 02/17/2023   HEPBSAG NEGATIVE 05/13/2011   HEPBCAB NEG 05/13/2011   Hepatitis C No results found for: "HEPCAB", "HCVRNAPCRQN" Hepatitis A Lab Results  Component Value Date   HAV REACTIVE (A) 02/17/2023  Lipids: Lab Results  Component Value Date   CHOL 208 (H) 06/17/2023   TRIG 75 06/17/2023   HDL 53 06/17/2023   CHOLHDL 3.9 06/17/2023   VLDL 17 06/10/2016   LDLCALC 138 (H) 06/17/2023    TARGET DATE: The 15th  Assessment: Ryan Martinez presents today for maintenance Cabenuva  injections. Past injections were tolerated well without issues. Last HIV RNA was undetectable in January. Last CD4 count was 825 in July 2024. Due for HIV RNA and CD4 count today. Doing well with no issues today. Agrees to full STI testing. Discussed eligibility for PCV20 and Shingrix and patient deferred these to next visit.  Administered cabotegravir  600mg /25mL in left upper outer quadrant of the gluteal muscle. Administered rilpivirine  900 mg/3mL in the right upper outer quadrant of the gluteal muscle. No issues with injections. He will follow up in 2 months for  next set of injections.  Plan: - Cabenuva  injections administered - HIV RNA, CD4 count and oral/rectal/urine cytologies today  - Next injections scheduled for 12/16/23 with Ryan Martinez and 02/17/24 with Ryan Martinez - Call with any issues or questions  Georga Killings, PharmD PGY-1 Pharmacy Resident

## 2023-10-21 ENCOUNTER — Ambulatory Visit (INDEPENDENT_AMBULATORY_CARE_PROVIDER_SITE_OTHER): Payer: BC Managed Care – PPO | Admitting: Pharmacist

## 2023-10-21 ENCOUNTER — Other Ambulatory Visit: Payer: Self-pay

## 2023-10-21 DIAGNOSIS — Z113 Encounter for screening for infections with a predominantly sexual mode of transmission: Secondary | ICD-10-CM

## 2023-10-21 DIAGNOSIS — B20 Human immunodeficiency virus [HIV] disease: Secondary | ICD-10-CM

## 2023-10-21 MED ORDER — CABOTEGRAVIR & RILPIVIRINE ER 600 & 900 MG/3ML IM SUER
1.0000 | Freq: Once | INTRAMUSCULAR | Status: AC
Start: 2023-10-21 — End: 2023-10-21
  Administered 2023-10-21: 1 via INTRAMUSCULAR

## 2023-10-22 LAB — CT/NG RNA, TMA RECTAL
Chlamydia Trachomatis RNA: NOT DETECTED
Neisseria Gonorrhoeae RNA: NOT DETECTED

## 2023-10-22 LAB — C. TRACHOMATIS/N. GONORRHOEAE RNA
C. trachomatis RNA, TMA: NOT DETECTED
N. gonorrhoeae RNA, TMA: NOT DETECTED

## 2023-10-22 LAB — GC/CHLAMYDIA PROBE, AMP (THROAT)
Chlamydia trachomatis RNA: NOT DETECTED
Neisseria gonorrhoeae RNA: NOT DETECTED

## 2023-10-26 LAB — RPR: RPR Ser Ql: REACTIVE — AB

## 2023-10-26 LAB — HIV-1 RNA QUANT-NO REFLEX-BLD
HIV 1 RNA Quant: NOT DETECTED {copies}/mL
HIV-1 RNA Quant, Log: NOT DETECTED {Log_copies}/mL

## 2023-10-26 LAB — RPR TITER: RPR Titer: 1:1 {titer} — ABNORMAL HIGH

## 2023-10-26 LAB — T-HELPER CELLS (CD4) COUNT (NOT AT ARMC)
Absolute CD4: 946 {cells}/uL (ref 490–1740)
CD4 T Helper %: 51 % (ref 30–61)
Total lymphocyte count: 1872 {cells}/uL (ref 850–3900)

## 2023-10-26 LAB — T PALLIDUM AB: T Pallidum Abs: POSITIVE — AB

## 2023-12-07 ENCOUNTER — Other Ambulatory Visit (HOSPITAL_COMMUNITY): Payer: Self-pay

## 2023-12-07 ENCOUNTER — Other Ambulatory Visit: Payer: Self-pay

## 2023-12-07 NOTE — Progress Notes (Signed)
 Specialty Pharmacy Refill Coordination Note  Ryan Martinez is a 33 y.o. male assessed today regarding refills of clinic administered specialty medication(s) Cabotegravir  & Rilpivirine  (Cabenuva )   Clinic requested Courier to Provider Office   Delivery date: 12/10/23   Verified address: [address] [place] [address]   Medication will be filled on 12/09/23.

## 2023-12-09 ENCOUNTER — Other Ambulatory Visit: Payer: Self-pay

## 2023-12-10 ENCOUNTER — Telehealth: Payer: Self-pay

## 2023-12-10 NOTE — Telephone Encounter (Signed)
 [facility] Patient Advocate Encounter  Patient's medications CABENUVA  have been couriered to [facility] from [facility] and will be administered at the patients appointment on 12/16/23.  Charmaine Sharps, CPhT Specialty Pharmacy Patient [facility] for Infectious Disease Phone: [phone] Fax:  [phone]

## 2023-12-15 NOTE — Progress Notes (Signed)
 HPI: Ryan Martinez is a 33 y.o. male who presents to the [facility] pharmacy clinic for Cabenuva  administration.  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   CLINDAMYCIN  (CLINDAGEL) 1 % GEL    APPLY TO AFFECTED AREA TWICE A DAY   HIBICLENS  4 % EXTERNAL LIQUID    Apply topically daily as needed.   HYDROCHLOROTHIAZIDE  (HYDRODIURIL ) 25 MG TABLET    Take 1 tablet (25 mg total) by mouth daily.   KETOCONAZOLE  (NIZORAL ) 2 % CREAM    Apply to bottoms of feet and between toes twice a day   TRAMADOL  (ULTRAM ) 50 MG TABLET    Take 1 tablet (50 mg total) by mouth every 6 (six) hours as needed (pain).  Modified Medications   No medications on file  Discontinued Medications   No medications on file    Allergies: Allergies  Allergen Reactions   Morphine And Codeine Itching and Rash    Labs: Lab Results  Component Value Date   HIV1RNAQUANT NOT DETECTED 10/21/2023   HIV1RNAQUANT Not Detected 06/17/2023   HIV1RNAQUANT <20 (H) 02/17/2023   CD4TABS 825 01/07/2023   CD4TABS 816 03/26/2022   CD4TABS 580 12/02/2021    RPR and STI Lab Results  Component Value Date   LABRPR REACTIVE (A) 10/21/2023   LABRPR REACTIVE (A) 06/17/2023   LABRPR REACTIVE (A) 01/07/2023   LABRPR REACTIVE (A) 03/26/2022   LABRPR NON-REACTIVE 09/17/2021   RPRTITER 1:1 (H) 10/21/2023   RPRTITER 1:2 (H) 06/17/2023   RPRTITER 1:1 (H) 01/07/2023   RPRTITER 1:1 (H) 03/26/2022   RPRTITER 1:2 (H) 06/06/2021    STI Results GC CT  01/21/2023 11:32 AM  Negative    Negative  Negative    Negative   01/21/2023 11:28 AM Negative  Negative   09/17/2021  8:44 AM Negative    Negative    Negative  Negative    Negative    Negative   09/03/2020 10:15 AM Negative    Negative  Negative    Negative   03/16/2020 10:37 AM Negative  Negative   05/03/2019  2:11 PM Negative    Negative  Negative    Negative   05/03/2019 10:16 AM Negative  Negative   12/02/2018 12:00 AM Negative  Negative   04/28/2018 12:00 AM Negative  Negative   07/03/2017 12:00 AM Negative  Negative   03/26/2017 12:00 AM Negative  Negative   06/10/2016 12:00 AM Negative  Negative   10/24/2015 12:00 AM Negative  Negative   05/10/2015 12:00 AM * Negative    * Negative    Negative  * Negative    * Negative    Negative   08/07/2014 12:00 AM NG: Negative  CT: Negative     Hepatitis B Lab Results  Component Value Date   HEPBSAB REACTIVE (A) 02/17/2023   HEPBSAG NEGATIVE 05/13/2011   HEPBCAB NEG 05/13/2011   Hepatitis C No results found for: HEPCAB, HCVRNAPCRQN Hepatitis A Lab Results  Component Value Date   HAV REACTIVE (A) 02/17/2023  Lipids: Lab Results  Component Value Date   CHOL 208 (H) 06/17/2023   TRIG 75 06/17/2023   HDL 53 06/17/2023   CHOLHDL 3.9 06/17/2023   VLDL 17 06/10/2016   LDLCALC 138 (H) 06/17/2023    TARGET DATE: The 15th  Assessment: Geo presents today for his maintenance Cabenuva  injections. Past injections were tolerated well without issues. Last HIV RNA was not detected in May. Doing well with no issues today.   Administered cabotegravir  600mg /69mL in left upper outer quadrant of the gluteal muscle. Administered rilpivirine  900 mg/3mL in the right upper outer quadrant of the gluteal muscle. No issues with injections. he will follow up in 2 months for next set of injections.  Plan: - Cabenuva  injections administered - Next injections scheduled for 02/17/24 with Dr. Luiz and 04/20/24 with me - Call with any issues or  questions  Zabria Liss L. Brianda Beitler, PharmD, BCIDP, AAHIVP, CPP Clinical Pharmacist Practitioner - Infectious Diseases Clinical Pharmacist Lead - Specialty Pharmacy [facility] for Infectious Disease 12/15/2023, 3:40 PM

## 2023-12-16 ENCOUNTER — Ambulatory Visit: Admitting: Pharmacist

## 2023-12-16 ENCOUNTER — Other Ambulatory Visit: Payer: Self-pay

## 2023-12-16 DIAGNOSIS — Z113 Encounter for screening for infections with a predominantly sexual mode of transmission: Secondary | ICD-10-CM

## 2023-12-16 DIAGNOSIS — B20 Human immunodeficiency virus [HIV] disease: Secondary | ICD-10-CM | POA: Diagnosis not present

## 2023-12-16 MED ORDER — CABOTEGRAVIR & RILPIVIRINE ER 600 & 900 MG/3ML IM SUER
1.0000 | Freq: Once | INTRAMUSCULAR | Status: AC
  Administered 2023-12-16: 1 via INTRAMUSCULAR

## 2023-12-22 ENCOUNTER — Telehealth: Payer: Self-pay

## 2023-12-22 ENCOUNTER — Other Ambulatory Visit (HOSPITAL_COMMUNITY): Payer: Self-pay

## 2023-12-22 NOTE — Telephone Encounter (Signed)
 Pharmacy Patient Advocate Encounter   Received notification from CoverMyMeds that prior authorization for CABENUVA  is required/requested.   Insurance verification completed.   The patient is insured through [employer] [facility] .   Per test claim: PA required; PA submitted to above mentioned insurance via CoverMyMeds Key/confirmation #/EOC [id number] Status is pending

## 2023-12-22 NOTE — Telephone Encounter (Signed)
 Pharmacy Patient Advocate Encounter  Received notification from [employer] [facility] that Prior Authorization for CABENUVA  has been APPROVED from 11/22/2023 to 12/21/2024   PA #/Case ID/Reference #: [id number]

## 2024-01-25 ENCOUNTER — Telehealth: Payer: Self-pay

## 2024-01-25 ENCOUNTER — Other Ambulatory Visit (HOSPITAL_COMMUNITY): Payer: Self-pay

## 2024-01-25 NOTE — Telephone Encounter (Signed)
 Pharmacy Patient Advocate Encounter   Received notification from CoverMyMeds that prior authorization for CABENUVA  is required/requested.   Insurance verification completed.   The patient is insured through [employer] [facility] .   Per test claim: PA required; PA submitted to above mentioned insurance via Latent Key/confirmation #/EOC [facility] Status is pending   Pharmacy Patient Advocate Encounter  Received notification from [employer] [facility] that Prior Authorization for CABENUVA  has been CANCELLED due to PA NOT NEEDED AT THIS TIME.   PA #/Case ID/Reference #:

## 2024-02-04 ENCOUNTER — Other Ambulatory Visit (HOSPITAL_COMMUNITY): Payer: Self-pay

## 2024-02-04 ENCOUNTER — Other Ambulatory Visit: Payer: Self-pay

## 2024-02-04 ENCOUNTER — Other Ambulatory Visit: Payer: Self-pay | Admitting: Pharmacist

## 2024-02-04 DIAGNOSIS — B20 Human immunodeficiency virus [HIV] disease: Secondary | ICD-10-CM

## 2024-02-04 MED ORDER — CABENUVA 600 & 900 MG/3ML IM SUER
1.0000 | INTRAMUSCULAR | 5 refills | Status: AC
  Filled 2024-02-04: qty 6, 60d supply, fill #0
  Filled 2024-04-06: qty 6, 60d supply, fill #1
  Filled 2024-06-15: qty 6, 60d supply, fill #2

## 2024-02-04 NOTE — Progress Notes (Signed)
 Specialty Pharmacy Refill Coordination Note  Ryan Martinez is a 33 y.o. male assessed today regarding refills of clinic administered specialty medication(s) Cabotegravir  & Rilpivirine  (Cabenuva )   Clinic requested Courier to Provider Office   Delivery date: 02/11/24   Verified address: [address] [place] [address]   Medication will be filled on 02/10/24.

## 2024-02-10 ENCOUNTER — Other Ambulatory Visit: Payer: Self-pay

## 2024-02-11 ENCOUNTER — Telehealth: Payer: Self-pay

## 2024-02-11 NOTE — Telephone Encounter (Signed)
 [facility] Patient Advocate Encounter  Patient's medications Cabenuva  have been couriered to [facility] from [facility] and will be administered at the patients appointment on 02/17/24.  Ryan Martinez, CPhT Specialty Pharmacy Patient [facility] for Infectious Disease Phone: [phone] Fax:  [phone]

## 2024-02-11 NOTE — Telephone Encounter (Signed)
 [facility] Patient Advocate Encounter  Patient's medications CABENUVA  have been couriered to [facility] from [facility] and will be administered at the patients appointment on 02/17/24.  Charmaine Sharps, CPhT Specialty Pharmacy Patient [facility] for Infectious Disease Phone: [phone] Fax:  [phone]

## 2024-02-17 ENCOUNTER — Other Ambulatory Visit (HOSPITAL_COMMUNITY)
Admission: RE | Admit: 2024-02-17 | Discharge: 2024-02-17 | Disposition: A | Discharge status: Home / Self Care | Source: Ambulatory Visit | Attending: Internal Medicine | Admitting: Internal Medicine

## 2024-02-17 ENCOUNTER — Ambulatory Visit: Admitting: Internal Medicine

## 2024-02-17 ENCOUNTER — Other Ambulatory Visit: Payer: Self-pay

## 2024-02-17 ENCOUNTER — Encounter: Payer: Self-pay | Admitting: Internal Medicine

## 2024-02-17 VITALS — BP 142/98 | HR 94 | Temp 98.0°F | Ht 70.0 in | Wt 262.0 lb

## 2024-02-17 DIAGNOSIS — Z23 Encounter for immunization: Secondary | ICD-10-CM

## 2024-02-17 DIAGNOSIS — B20 Human immunodeficiency virus [HIV] disease: Secondary | ICD-10-CM | POA: Diagnosis present

## 2024-02-17 DIAGNOSIS — Z0000 Encounter for general adult medical examination without abnormal findings: Secondary | ICD-10-CM | POA: Insufficient documentation

## 2024-02-17 DIAGNOSIS — I10 Essential (primary) hypertension: Secondary | ICD-10-CM

## 2024-02-17 MED ORDER — COVID-19 MRNA VACC (MODERNA) 50 MCG/0.5ML IM SUSY: 0.5000 mL | PREFILLED_SYRINGE | Freq: Once | INTRAMUSCULAR | 0 refills | Status: AC

## 2024-02-17 MED ORDER — BENAZEPRIL-HYDROCHLOROTHIAZIDE 20-25 MG PO TABS: 1.0000 | ORAL_TABLET | Freq: Every day | ORAL | 11 refills | Status: AC

## 2024-02-17 MED ORDER — CABOTEGRAVIR & RILPIVIRINE ER 600 & 900 MG/3ML IM SUER
1.0000 | Freq: Once | INTRAMUSCULAR | Status: AC
  Administered 2024-02-17: 1 via INTRAMUSCULAR

## 2024-02-17 NOTE — Progress Notes (Signed)
 Patient ID: Ryan Martinez, male   DOB: [birth year], 33 y.o.   MRN: [id number]  HPI Ryan Martinez is a 33yo M with well controlled HIV disease, currently on cabenuva . He reports that he is In good health BP running higher lately. 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   hydrochlorothiazide  (HYDRODIURIL ) 25 MG tablet Take 1 tablet (25 mg total) by mouth daily.   clindamycin  (CLINDAGEL) 1 % gel APPLY TO AFFECTED AREA TWICE A DAY   HIBICLENS  4 % external liquid Apply topically daily as needed.   ketoconazole  (NIZORAL ) 2 % cream Apply to bottoms of feet and between toes twice a day   traMADol  (ULTRAM ) 50 MG tablet Take 1 tablet (50 mg total) by mouth every 6 (six) hours as needed (pain).   [DISCONTINUED] cabotegravir  & rilpivirine  ER (CABENUVA ) 600 & 900 MG/3ML injection Inject 1 kit into the muscle every 2 (two) months.   No facility-administered encounter medications on file as of 02/17/2024.     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  HENT: Negative for congestion, sore throat, rhinorrhea, sneezing, trouble swallowing and sinus pressure.  Eyes: Negative for  photophobia and visual disturbance.  Respiratory: Negative for cough, chest tightness, shortness of breath, wheezing and stridor.  Cardiovascular: Negative for chest pain, palpitations and leg swelling.  Gastrointestinal: Negative for nausea, vomiting, abdominal pain, diarrhea, constipation, blood in stool, abdominal distention and anal bleeding.  Genitourinary: Negative for dysuria, hematuria, flank pain and difficulty urinating.  Musculoskeletal: Negative for myalgias, back pain, joint swelling, arthralgias and gait problem.  Skin: Negative for color change, pallor, rash and wound.  Neurological: Negative for dizziness, tremors, weakness and light-headedness.  Hematological: Negative for adenopathy. Does not bruise/bleed easily.  Psychiatric/Behavioral: Negative for behavioral problems, confusion, sleep disturbance, dysphoric mood, decreased concentration and agitation.   Physical Exam   BP (!) 142/98   Pulse 94   Temp 98 F (36.7 C)   Ht 5' 10 (1.778 m)   Wt 262 lb (118.8 kg)   SpO2 98%   BMI 37.59 kg/m   Physical Exam  Constitutional: He is oriented to person, place, and time. He appears well-developed and well-nourished. No distress.  HENT:  Mouth/Throat: Oropharynx is clear and moist. No oropharyngeal exudate.  Cardiovascular: Normal rate, regular rhythm and normal heart sounds. Exam reveals no gallop and no friction rub.  No murmur heard.  Pulmonary/Chest: Effort normal and breath sounds normal. No respiratory distress. He has no wheezes.  Lymphadenopathy:  He has no cervical adenopathy.  Neurological: He is alert and oriented to person, place, and time.  Skin: Skin is warm and dry. No rash noted. No erythema.  Psychiatric: He  has a normal mood and affect. His behavior is normal.   Lab Results  Component Value Date   CD4TCELL 51 10/21/2023   Lab Results  Component Value Date   CD4TABS 825 01/07/2023   CD4TABS 816 03/26/2022   CD4TABS 580 12/02/2021   Lab Results   Component Value Date   HIV1RNAQUANT NOT DETECTED 10/21/2023   Lab Results  Component Value Date   HEPBSAB REACTIVE (A) 02/17/2023   Lab Results  Component Value Date   LABRPR REACTIVE (A) 10/21/2023    CBC Lab Results  Component Value Date   WBC 5.9 06/17/2023   RBC 6.76 (H) 06/17/2023   HGB 15.6 06/17/2023   HCT 49.6 06/17/2023   PLT 281 06/17/2023   MCV 73.4 (L) 06/17/2023   MCH 23.1 (L) 06/17/2023   MCHC 31.5 (L) 06/17/2023   RDW 13.5 06/17/2023   LYMPHSABS 1,767 01/07/2023   MONOABS 670 06/10/2016   EOSABS 171 06/17/2023    BMET Lab Results  Component Value Date   NA 140 06/17/2023   K 4.0 06/17/2023   CL 104 06/17/2023   CO2 27 06/17/2023   GLUCOSE 98 06/17/2023   BUN 12 06/17/2023   CREATININE 0.86 06/17/2023   CALCIUM 9.6 06/17/2023   GFRNONAA 92 08/21/2020   GFRAA 106 08/21/2020    Family hx: colon ca -father dx at 72 yo  Assessment and Plan   Health maintenance Flu vac and Prescription for covid and check lipid profile (since he has fasted)  HIV= will get Labs and sti testing and anal pap;Cabenuva  injection today- he will be also coming back in 2 months for next injection  Long term medication management = will check cr  Recheck BP ( 138/98) --  will switch hydrochlorothiazide /lisinopril 10mg ; looking back on previous visits, his sBP still running slightly elevated. Thus, we will proceed with changing his BP meds   Already has hpv vaccine

## 2024-02-17 NOTE — Addendum Note (Signed)
 Addended by: GRETEL TULLY HERO on: 02/17/2024 03:20 PM   Modules accepted: Orders

## 2024-02-18 LAB — CYTOLOGY, (ORAL, ANAL, URETHRAL) ANCILLARY ONLY
Chlamydia: NEGATIVE
Chlamydia: NEGATIVE
Comment: NEGATIVE
Comment: NEGATIVE
Comment: NORMAL
Comment: NORMAL
Neisseria Gonorrhea: NEGATIVE
Neisseria Gonorrhea: NEGATIVE

## 2024-02-18 LAB — URINE CYTOLOGY ANCILLARY ONLY
Chlamydia: NEGATIVE
Comment: NEGATIVE
Comment: NORMAL
Neisseria Gonorrhea: NEGATIVE

## 2024-02-18 LAB — T-HELPER CELL (CD4) - (RCID CLINIC ONLY)
CD4 % Helper T Cell: 45 % (ref 33–65)
CD4 T Cell Abs: 955 /uL (ref 400–1790)

## 2024-02-20 LAB — CBC WITH DIFFERENTIAL/PLATELET
Absolute Lymphocytes: 2297 {cells}/uL (ref 850–3900)
Absolute Monocytes: 907 {cells}/uL (ref 200–950)
Basophils Absolute: 43 {cells}/uL (ref 0–200)
Basophils Relative: 0.6 %
Eosinophils Absolute: 144 {cells}/uL (ref 15–500)
Eosinophils Relative: 2 %
HCT: 54.8 % — ABNORMAL HIGH (ref 38.5–50.0)
Hemoglobin: 16.3 g/dL (ref 13.2–17.1)
MCH: 22.9 pg — ABNORMAL LOW (ref 27.0–33.0)
MCHC: 29.7 g/dL — ABNORMAL LOW (ref 32.0–36.0)
MCV: 76.9 fL — ABNORMAL LOW (ref 80.0–100.0)
MPV: 9.5 fL (ref 7.5–12.5)
Monocytes Relative: 12.6 %
Neutro Abs: 3809 {cells}/uL (ref 1500–7800)
Neutrophils Relative %: 52.9 %
Platelets: 345 Thousand/uL (ref 140–400)
RBC: 7.13 Million/uL — ABNORMAL HIGH (ref 4.20–5.80)
RDW: 13.8 % (ref 11.0–15.0)
Total Lymphocyte: 31.9 %
WBC: 7.2 Thousand/uL (ref 3.8–10.8)

## 2024-02-20 LAB — LIPID PANEL
Cholesterol: 233 mg/dL — ABNORMAL HIGH (ref <200)
HDL: 42 mg/dL (ref >=40)
LDL Cholesterol (Calc): 162 mg/dL — ABNORMAL HIGH
Non-HDL Cholesterol (Calc): 191 mg/dL — ABNORMAL HIGH (ref <130)
Total CHOL/HDL Ratio: 5.5 (calc) — ABNORMAL HIGH (ref <5.0)
Triglycerides: 145 mg/dL (ref <150)

## 2024-02-20 LAB — COMPLETE METABOLIC PANEL WITHOUT GFR
AG Ratio: 1.5 (calc) (ref 1.0–2.5)
ALT: 28 U/L (ref 9–46)
AST: 18 U/L (ref 10–40)
Albumin: 4.8 g/dL (ref 3.6–5.1)
Alkaline phosphatase (APISO): 54 U/L (ref 36–130)
BUN: 13 mg/dL (ref 7–25)
CO2: 31 mmol/L (ref 20–32)
Calcium: 10.2 mg/dL (ref 8.6–10.3)
Chloride: 97 mmol/L — ABNORMAL LOW (ref 98–110)
Creat: 1.09 mg/dL (ref 0.60–1.26)
Globulin: 3.2 g/dL (ref 1.9–3.7)
Glucose, Bld: 87 mg/dL (ref 65–99)
Potassium: 4.2 mmol/L (ref 3.5–5.3)
Sodium: 137 mmol/L (ref 135–146)
Total Bilirubin: 0.8 mg/dL (ref 0.2–1.2)
Total Protein: 8 g/dL (ref 6.1–8.1)

## 2024-02-20 LAB — RPR TITER: RPR Titer: 1:1 {titer} — ABNORMAL HIGH

## 2024-02-20 LAB — T PALLIDUM AB: T Pallidum Abs: POSITIVE — AB

## 2024-02-20 LAB — HIV-1 RNA QUANT-NO REFLEX-BLD
HIV 1 RNA Quant: NOT DETECTED {copies}/mL
HIV-1 RNA Quant, Log: NOT DETECTED {Log_copies}/mL

## 2024-02-20 LAB — RPR: RPR Ser Ql: REACTIVE — AB

## 2024-02-22 LAB — CYTOLOGY - PAP
Comment: NEGATIVE
Diagnosis: UNDETERMINED — AB
High risk HPV: POSITIVE — AB

## 2024-03-05 ENCOUNTER — Other Ambulatory Visit: Payer: Self-pay | Admitting: Internal Medicine

## 2024-03-05 ENCOUNTER — Ambulatory Visit: Payer: Self-pay | Admitting: Internal Medicine

## 2024-03-05 DIAGNOSIS — R85619 Unspecified abnormal cytological findings in specimens from anus: Secondary | ICD-10-CM

## 2024-03-09 ENCOUNTER — Encounter (INDEPENDENT_AMBULATORY_CARE_PROVIDER_SITE_OTHER): Payer: Self-pay

## 2024-04-06 ENCOUNTER — Other Ambulatory Visit: Payer: Self-pay

## 2024-04-06 ENCOUNTER — Other Ambulatory Visit (HOSPITAL_COMMUNITY): Payer: Self-pay

## 2024-04-06 NOTE — Progress Notes (Signed)
 Specialty Pharmacy Refill Coordination Note  MASAJI BILLUPS is a 33 y.o. male assessed today regarding refills of clinic administered specialty medication(s) Cabotegravir  & Rilpivirine  (Cabenuva )   Clinic requested Courier to Provider Office   Delivery date: 04/11/24   Verified address: [address] [place] [address]   Medication will be filled on 04/08/24.

## 2024-04-08 ENCOUNTER — Other Ambulatory Visit: Payer: Self-pay

## 2024-04-11 ENCOUNTER — Telehealth: Payer: Self-pay

## 2024-04-11 NOTE — Telephone Encounter (Signed)
 [facility] Patient Advocate Encounter  Patient's medications Cabenuva  have been couriered to [facility] from [facility] and will be administered at the patients appointment on 04/20/24.  Arland Hutchinson, CPhT Specialty Pharmacy Patient [facility] for Infectious Disease Phone: [phone] Fax:  [phone]

## 2024-04-19 NOTE — Progress Notes (Unsigned)
 HPI: Ryan Martinez is a 33 y.o. male who presents to the [facility] pharmacy clinic for Cabenuva  administration.  Referring ID Provider: Dr. 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   CABOTEGRAVIR  & RILPIVIRINE  ER (CABENUVA ) 600 & 900 MG/3ML INJECTION    Inject 1 kit into the muscle every 2 (two) months.  Modified Medications   No medications on file  Discontinued Medications   No medications on file    Allergies: Allergies  Allergen Reactions   Morphine And Codeine Itching and Rash    Labs: Lab Results  Component Value Date   HIV1RNAQUANT NOT DETECTED 02/17/2024   HIV1RNAQUANT NOT DETECTED 10/21/2023   HIV1RNAQUANT Not Detected 06/17/2023   CD4TABS 955 02/17/2024   CD4TABS 825 01/07/2023   CD4TABS 816 03/26/2022    RPR and STI Lab Results  Component Value Date   LABRPR REACTIVE (A) 02/17/2024   LABRPR REACTIVE (A) 10/21/2023   LABRPR REACTIVE (A) 06/17/2023   LABRPR REACTIVE (A) 01/07/2023   LABRPR REACTIVE (A) 03/26/2022   RPRTITER 1:1 (H) 02/17/2024   RPRTITER 1:1 (H) 10/21/2023   RPRTITER 1:2 (H) 06/17/2023   RPRTITER 1:1 (H) 01/07/2023   RPRTITER 1:1 (H) 03/26/2022    STI Results GC CT  02/17/2024  3:19 PM Negative  Negative   02/17/2024  9:22 AM Negative  Negative   02/17/2024  9:21 AM Negative  Negative   01/21/2023 11:32 AM Negative    Negative  Negative    Negative   01/21/2023 11:28 AM Negative  Negative   09/17/2021  8:44 AM Negative    Negative    Negative  Negative    Negative    Negative    09/03/2020 10:15 AM Negative    Negative  Negative    Negative   03/16/2020 10:37 AM Negative  Negative   05/03/2019  2:11 PM Negative    Negative  Negative    Negative   05/03/2019 10:16 AM Negative  Negative   12/02/2018 12:00 AM Negative  Negative   04/28/2018 12:00 AM Negative  Negative   07/03/2017 12:00 AM Negative  Negative   03/26/2017 12:00 AM Negative  Negative   06/10/2016 12:00 AM Negative  Negative   10/24/2015 12:00 AM Negative  Negative   05/10/2015 12:00 AM * Negative    * Negative    Negative  * Negative    * Negative    Negative   08/07/2014 12:00 AM NG: Negative  CT: Negative     Hepatitis B Lab Results  Component Value Date   HEPBSAB REACTIVE (A) 02/17/2023   HEPBSAG NEGATIVE 05/13/2011   HEPBCAB NEG 05/13/2011   Hepatitis C No results found for: HEPCAB, HCVRNAPCRQN Hepatitis A Lab Results  Component Value Date   HAV REACTIVE (A) 02/17/2023   Lipids: Lab Results  Component Value Date   CHOL 233 (H) 02/17/2024   TRIG 145 02/17/2024   HDL 42 02/17/2024   CHOLHDL 5.5 (H) 02/17/2024   VLDL 17 06/10/2016   LDLCALC 162 (H) 02/17/2024    Target  Date: 15th   Assessment: Ryan Martinez presents today for his maintenance Cabenuva  injections. Past injections were tolerated well without issues. Last HIV RNA was undetectable on 02/17/2024. Doing well with no issues today.  Lab work:  *** Urine cytology for [employer], RPR  Eligible vaccinations: Covid, PCV20, Shingrix  ***  Cabenuva : Administered cabotegravir  600mg /55mL in left upper outer quadrant of the gluteal muscle. Administered rilpivirine  900 mg/3mL in the right upper outer quadrant of the gluteal muscle. No issues with injections. Jay will follow up in 2 months for next set of injections.  Plan: - Cabenuva  injections administered - Next injections scheduled for ***1/8 - 1/22, then 3/9 - 3/20  - Call with any issues or questions  Feliciano Close, PharmD PGY2 Infectious Diseases  Pharmacy Resident

## 2024-04-20 ENCOUNTER — Other Ambulatory Visit: Payer: Self-pay

## 2024-04-20 ENCOUNTER — Ambulatory Visit: Admitting: Pharmacist

## 2024-04-20 DIAGNOSIS — Z23 Encounter for immunization: Secondary | ICD-10-CM

## 2024-04-20 DIAGNOSIS — B20 Human immunodeficiency virus [HIV] disease: Secondary | ICD-10-CM

## 2024-04-20 MED ORDER — CABOTEGRAVIR & RILPIVIRINE ER 600 & 900 MG/3ML IM SUER
1.0000 | Freq: Once | INTRAMUSCULAR | Status: AC
  Administered 2024-04-20: 1 via INTRAMUSCULAR

## 2024-04-20 NOTE — Progress Notes (Signed)
 HPI: Ryan Martinez is a 33 y.o. male who presents to the [facility] pharmacy clinic for Cabenuva  administration.  Referring ID Provider: Dr. 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   CABOTEGRAVIR  & RILPIVIRINE  ER (CABENUVA ) 600 & 900 MG/3ML INJECTION    Inject 1 kit into the muscle every 2 (two) months.  Modified Medications   No medications on file  Discontinued Medications   No medications on file    Allergies: Allergies  Allergen Reactions   Morphine And Codeine Itching and Rash    Labs: Lab Results  Component Value Date   HIV1RNAQUANT NOT DETECTED 02/17/2024   HIV1RNAQUANT NOT DETECTED 10/21/2023   HIV1RNAQUANT Not Detected 06/17/2023   CD4TABS 955 02/17/2024   CD4TABS 825 01/07/2023   CD4TABS 816 03/26/2022    RPR and STI Lab Results  Component Value Date   LABRPR REACTIVE (A) 02/17/2024   LABRPR REACTIVE (A) 10/21/2023   LABRPR REACTIVE (A) 06/17/2023   LABRPR REACTIVE (A) 01/07/2023   LABRPR REACTIVE (A) 03/26/2022   RPRTITER 1:1 (H) 02/17/2024   RPRTITER 1:1 (H) 10/21/2023   RPRTITER 1:2 (H) 06/17/2023   RPRTITER 1:1 (H) 01/07/2023   RPRTITER 1:1 (H) 03/26/2022    STI Results GC CT  02/17/2024  3:19 PM Negative  Negative   02/17/2024  9:22 AM Negative  Negative   02/17/2024  9:21 AM Negative  Negative   01/21/2023 11:32 AM Negative    Negative  Negative    Negative   01/21/2023 11:28 AM Negative  Negative   09/17/2021  8:44 AM Negative    Negative    Negative  Negative    Negative    Negative    09/03/2020 10:15 AM Negative    Negative  Negative    Negative   03/16/2020 10:37 AM Negative  Negative   05/03/2019  2:11 PM Negative    Negative  Negative    Negative   05/03/2019 10:16 AM Negative  Negative   12/02/2018 12:00 AM Negative  Negative   04/28/2018 12:00 AM Negative  Negative   07/03/2017 12:00 AM Negative  Negative   03/26/2017 12:00 AM Negative  Negative   06/10/2016 12:00 AM Negative  Negative   10/24/2015 12:00 AM Negative  Negative   05/10/2015 12:00 AM * Negative    * Negative    Negative  * Negative    * Negative    Negative   08/07/2014 12:00 AM NG: Negative  CT: Negative     Hepatitis B Lab Results  Component Value Date   HEPBSAB REACTIVE (A) 02/17/2023   HEPBSAG NEGATIVE 05/13/2011   HEPBCAB NEG 05/13/2011   Hepatitis C No results found for: HEPCAB, HCVRNAPCRQN Hepatitis A Lab Results  Component Value Date   HAV REACTIVE (A) 02/17/2023   Lipids: Lab Results  Component Value Date   CHOL 233 (H) 02/17/2024   TRIG 145 02/17/2024   HDL 42 02/17/2024   CHOLHDL 5.5 (H) 02/17/2024   VLDL 17 06/10/2016   LDLCALC 162 (H) 02/17/2024    Target  Date: The 15th  Assessment: Ryan Martinez presents today for his maintenance Cabenuva  injections. Past injections were tolerated well without issues. Last HIV RNA was not detected in September. Doing well with no issues today.  Lab work:  None today; declines need for sexual health screenings  Eligible vaccinations:  He was not able to get the COVID vaccine from the prescription Dr. Luiz gave him back in September. We have the vaccine in clinic now so will administer that today.  Cabenuva : Administered cabotegravir  600mg /3mL in left upper outer quadrant of the gluteal muscle. Administered rilpivirine  900 mg/3mL in the right upper outer quadrant of the gluteal muscle. No issues with injections. He will follow up in 2 months for next set of injections.  Plan: - Cabenuva  injections administered -  COVID vaccine administered - Next injections scheduled for 06/29/24 with me and 08/17/24 with Dr. Luiz - Call with any issues or questions  Castella Lerner L. Dewaun Kinzler, PharmD, BCIDP, AAHIVP, CPP Clinical Pharmacist Practitioner - Infectious Diseases Clinical Pharmacist Lead - Specialty Pharmacy [facility] for Infectious Disease

## 2024-04-27 ENCOUNTER — Other Ambulatory Visit: Payer: Self-pay | Admitting: General Surgery

## 2024-05-02 LAB — SURGICAL PATHOLOGY

## 2024-05-21 ENCOUNTER — Encounter (HOSPITAL_COMMUNITY): Payer: Self-pay

## 2024-05-21 ENCOUNTER — Ambulatory Visit (HOSPITAL_COMMUNITY)
Admission: EM | Admit: 2024-05-21 | Discharge: 2024-05-21 | Disposition: A | Discharge status: Home / Self Care | Attending: Nurse Practitioner | Admitting: Nurse Practitioner

## 2024-05-21 DIAGNOSIS — J029 Acute pharyngitis, unspecified: Secondary | ICD-10-CM

## 2024-05-21 DIAGNOSIS — J039 Acute tonsillitis, unspecified: Secondary | ICD-10-CM

## 2024-05-21 LAB — POCT RAPID STREP A (OFFICE): Rapid Strep A Screen: NEGATIVE

## 2024-05-21 LAB — POC COVID19/FLU A&B COMBO
Covid Antigen, POC: NEGATIVE
Influenza A Antigen, POC: NEGATIVE
Influenza B Antigen, POC: NEGATIVE

## 2024-05-21 NOTE — ED Triage Notes (Addendum)
 Patient reports that he has had a sore throat and chills since last night.  Patient states he has white patches on the back of his throat.  Patient has not had any medication for his symptoms.  Patient is requesting a strep test due to history of strep.

## 2024-05-21 NOTE — ED Provider Notes (Signed)
 [facility]    CSN: [id number] Arrival date & time: 05/21/24  1713      History   Chief Complaint Chief Complaint  Patient presents with   Sore Throat   Chills    HPI Ryan Martinez is a 33 y.o. male.   Discussed the use of AI scribe software for clinical note transcription with the patient, who gave verbal consent to proceed.   The patient presents with sore throat and chills that started last night. The patient reports experiencing chills and throat pain, with white patches visible on the back of the throat. A low-grade fever of 100.36F was documented in the clinic, though the patient reports no fever at home or is unsure about home temperatures. The patient describes problems with swallowing, noting that it hurts to swallow. The patient denies runny nose, congestion, drainage going down the back of the throat, body aches, sweats, headaches, or vomiting. The patient has not been around anyone known to be sick or have strep throat. The patient states they have had strep before and feels this presents similarly. The patient has not taken any medications for the current symptoms.  The following sections of the patient's history were reviewed and updated as appropriate: allergies, current medications, past family history, past medical history, past social history, past surgical history, and problem list.     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 No pertinent surgical history.     Home Medications    Prior to Admission medications  Medication Sig Start Date End Date Taking? Authorizing  Provider  benazepril -hydrochlorthiazide (LOTENSIN  HCT) 20-25 MG tablet Take 1 tablet by mouth daily. 02/17/24   Luiz Channel, MD  cabotegravir  & rilpivirine  ER (CABENUVA ) 600 & 900 MG/3ML injection Inject 1 kit into the muscle every 2 (two) months. 02/04/24   Waddell Alan PARAS, RPH-CPP    Family History Family History  Problem Relation Age of Onset   Sarcoidosis Mother    Cancer Maternal Aunt     Social History Social History[1]   Allergies   Morphine and codeine   Review of Systems Review of Systems  Constitutional:  Positive for chills. Negative for diaphoresis and fever.  HENT:  Positive for sore throat. Negative for congestion, postnasal drip, rhinorrhea, sneezing and trouble swallowing.   Respiratory:  Negative for cough.   Gastrointestinal:  Negative for diarrhea, nausea and vomiting.  Musculoskeletal:  Negative for gait problem.  Neurological:  Negative for dizziness and headaches.  All other systems reviewed and are negative.    Physical Exam Triage Vital Signs ED Triage Vitals [05/21/24 1850]  Encounter Vitals Group     BP 126/85     Girls Systolic BP Percentile      Girls Diastolic BP Percentile      Boys Systolic BP Percentile      Boys Diastolic BP Percentile      Pulse Rate (!) 101     Resp 16     Temp 100.1 F (37.8 C)     Temp Source Oral     SpO2 94 %     Weight  Height      Head Circumference      Peak Flow      Pain Score 8     Pain Loc      Pain Education      Exclude from Growth Chart    No data found.  Updated Vital Signs BP 126/85 (BP Location: Left Arm)   Pulse (!) 101   Temp 100.1 F (37.8 C) (Oral)   Resp 16   SpO2 94%   Visual Acuity Right Eye Distance:   Left Eye Distance:   Bilateral Distance:    Right Eye Near:   Left Eye Near:    Bilateral Near:     Physical Exam Vitals reviewed.  Constitutional:      General: He is awake. He is not in acute distress.    Appearance: Normal appearance. He is well-developed.  He is obese. He is not ill-appearing, toxic-appearing or diaphoretic.  HENT:     Head: Normocephalic.     Right Ear: Hearing normal.     Left Ear: Hearing normal.     Nose: Nose normal.     Mouth/Throat:     Lips: Pink.     Mouth: Mucous membranes are moist.     Pharynx: Pharyngeal swelling, oropharyngeal exudate and posterior oropharyngeal erythema present. No uvula swelling.     Tonsils: No tonsillar exudate or tonsillar abscesses.  Eyes:     General: Vision grossly intact.     Conjunctiva/sclera: Conjunctivae normal.  Cardiovascular:     Rate and Rhythm: Normal rate.     Heart sounds: Normal heart sounds.  Pulmonary:     Effort: Pulmonary effort is normal. No tachypnea or respiratory distress.     Breath sounds: Normal breath sounds and air entry.  Musculoskeletal:        General: Normal range of motion.     Cervical back: Normal range of motion and neck supple.  Lymphadenopathy:     Cervical: No cervical adenopathy.  Skin:    General: Skin is warm and dry.  Neurological:     General: No focal deficit present.     Mental Status: He is alert and oriented to person, place, and time.  Psychiatric:        Behavior: Behavior is cooperative.      UC Treatments / Results  Labs (all labs ordered are listed, but only abnormal results are displayed) Labs Reviewed  CULTURE, GROUP A STREP [facility])  POCT RAPID STREP A (OFFICE)  POC COVID19/FLU A&B COMBO    EKG   Radiology No results found.  Procedures Procedures (including critical care time)  Medications Ordered in UC Medications - No data to display  Initial Impression / Assessment and Plan / UC Course  I have reviewed the triage vital signs and the nursing notes.  Pertinent labs & imaging results that were available during my care of the patient were reviewed by me and considered in my medical decision making (see chart for details).     The patient presents with sore throat consistent with viral pharyngitis. A  rapid strep test was negative, and a throat culture was sent for confirmation. COVID and FLU test today was also negative; however, this may represent a false negative as symptoms began only one day ago and testing may have been performed too early. Given the absence of bacterial findings and overall stable presentation, supportive care is recommended, including hydration, rest, warm or cold fluids, saltwater gargles, and over-the-counter medications as needed for  comfort. The patient will be notified if the throat culture returns positive and antibiotics are required. He should follow up with his primary care provider if symptoms worsen or do not improve within two days for COVID and FLU retesting. He should seek emergency care for difficulty breathing, inability to swallow, drooling, or signs of dehydration.  Today's evaluation has revealed no signs of a dangerous process. Discussed diagnosis with patient and/or guardian. Patient and/or guardian aware of their diagnosis, possible red flag symptoms to watch out for and need for close follow up. Patient and/or guardian understands verbal and written discharge instructions. Patient and/or guardian comfortable with plan and disposition.  Patient and/or guardian has a clear mental status at this time, good insight into illness (after discussion and teaching) and has clear judgment to make decisions regarding their care  Documentation was completed with the aid of voice recognition software. Transcription may contain typographical errors.   Final Clinical Impressions(s) / UC Diagnoses   Final diagnoses:  Acute sore throat  Acute tonsillitis, unspecified etiology     Discharge Instructions      You were seen today for a sore throat that is most consistent with a viral infection. Your rapid strep test was negative, and a throat culture was sent to confirm this result. Your COVID and flu tests were also negative today; however, because your symptoms  started only one day ago, it is possible these tests were done too early. You will be contacted if the throat culture comes back positive and antibiotics are needed.  For symptom relief, drink plenty of fluids and get adequate rest. Warm or cold drinks, throat lozenges, and saltwater gargles can help soothe throat pain. You may use over-the-counter medications such as acetaminophen  or ibuprofen  as directed for pain or fever. These treatments will not cure the illness but can help you feel more comfortable while your body heals.  Follow up with your primary care provider if symptoms worsen or do not begin to improve within the next two days, as repeat COVID or flu testing may be needed. Seek emergency care right away if you develop trouble breathing, inability to swallow, drooling, signs of dehydration such as very little urine or severe weakness, or any sudden or concerning worsening of symptoms.     ED Prescriptions   None    PDMP not reviewed this encounter.     [1]  Social History Tobacco Use   Smoking status: Heavy Smoker    Current packs/day: 0.10    Average packs/day: 0.1 packs/day for 1 year (0.1 ttl pk-yrs)    Types: Cigarettes, E-cigarettes   Smokeless tobacco: Never   Tobacco comments:    still working on quitting  Vaping Use   Vaping status: Never Used  Substance Use Topics   Alcohol use: Yes    Alcohol/week: 1.0 - 2.0 standard drink of alcohol    Types: 1 - 2 Shots of liquor per week    Comment: occ   Drug use: No     Iola Lukes, FNP 05/21/24 2007

## 2024-05-21 NOTE — Discharge Instructions (Addendum)
 You were seen today for a sore throat that is most consistent with a viral infection. Your rapid strep test was negative, and a throat culture was sent to confirm this result. Your COVID and flu tests were also negative today; however, because your symptoms started only one day ago, it is possible these tests were done too early. You will be contacted if the throat culture comes back positive and antibiotics are needed.  For symptom relief, drink plenty of fluids and get adequate rest. Warm or cold drinks, throat lozenges, and saltwater gargles can help soothe throat pain. You may use over-the-counter medications such as acetaminophen  or ibuprofen  as directed for pain or fever. These treatments will not cure the illness but can help you feel more comfortable while your body heals.  Follow up with your primary care provider if symptoms worsen or do not begin to improve within the next two days, as repeat COVID or flu testing may be needed. Seek emergency care right away if you develop trouble breathing, inability to swallow, drooling, signs of dehydration such as very little urine or severe weakness, or any sudden or concerning worsening of symptoms.

## 2024-05-23 ENCOUNTER — Ambulatory Visit (HOSPITAL_COMMUNITY): Payer: Self-pay

## 2024-05-23 LAB — CULTURE, GROUP A STREP (THRC): Special Requests: NORMAL

## 2024-05-23 NOTE — Progress Notes (Signed)
 The patient does not need antibiotics at this time, as symptoms are improving and the throat culture was negative for group A streptococcus.

## 2024-06-15 ENCOUNTER — Other Ambulatory Visit: Payer: Self-pay

## 2024-06-15 ENCOUNTER — Other Ambulatory Visit (HOSPITAL_COMMUNITY): Payer: Self-pay

## 2024-06-15 NOTE — Progress Notes (Signed)
 Patient's refill has been scheduled in [place].

## 2024-06-15 NOTE — Progress Notes (Signed)
 Specialty Pharmacy Refill Coordination Note  Ryan Martinez is a 34 y.o. male contacted today regarding refills of specialty medication(s) Cabotegravir  & Rilpivirine  (Cabenuva )   Patient requested Courier to Provider Office   Delivery date: 06/17/24   Verified address: [facility] [address] [place] [address]   Medication will be filled on: 06/15/24

## 2024-06-17 ENCOUNTER — Telehealth: Payer: Self-pay

## 2024-06-17 NOTE — Telephone Encounter (Signed)
 [facility] Patient Advocate Encounter  Patient's medications CABENUVA  have been couriered to [facility] from [facility] and will be administered at the patients appointment on 06/29/24.  Charmaine Sharps, CPhT Specialty Pharmacy Patient [facility] Phone: [phone] Fax:  [phone]

## 2024-06-28 NOTE — Progress Notes (Cosign Needed Addendum)
 "  HPI: Ryan Martinez is a 34 y.o. male who presents to the [facility] pharmacy clinic for Cabenuva  administration.  Referring ID Provider: Dr. 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   CABOTEGRAVIR  & RILPIVIRINE  ER (CABENUVA ) 600 & 900 MG/3ML INJECTION    Inject 1 kit into the muscle every 2 (two) months.  Modified Medications   No medications on file  Discontinued Medications   No medications on file    Allergies: Allergies[1]  Labs: Lab Results  Component Value Date   HIV1RNAQUANT NOT DETECTED 02/17/2024   HIV1RNAQUANT NOT DETECTED 10/21/2023   HIV1RNAQUANT Not Detected 06/17/2023   CD4TABS 955 02/17/2024   CD4TABS 825 01/07/2023   CD4TABS 816 03/26/2022    RPR and STI Lab Results  Component Value Date   LABRPR REACTIVE (A) 02/17/2024   LABRPR REACTIVE (A) 10/21/2023   LABRPR REACTIVE (A) 06/17/2023   LABRPR REACTIVE (A) 01/07/2023   LABRPR REACTIVE (A) 03/26/2022   RPRTITER 1:1 (H) 02/17/2024   RPRTITER 1:1 (H) 10/21/2023   RPRTITER 1:2 (H) 06/17/2023   RPRTITER 1:1 (H) 01/07/2023   RPRTITER 1:1 (H) 03/26/2022    STI Results GC CT  02/17/2024  3:19 PM Negative  Negative   02/17/2024  9:22 AM Negative  Negative   02/17/2024  9:21 AM Negative  Negative   01/21/2023 11:32 AM Negative    Negative  Negative    Negative   01/21/2023 11:28 AM Negative  Negative   09/17/2021  8:44 AM Negative    Negative    Negative  Negative    Negative    Negative   09/03/2020 10:15 AM Negative    Negative  Negative     Negative   03/16/2020 10:37 AM Negative  Negative   05/03/2019  2:11 PM Negative    Negative  Negative    Negative   05/03/2019 10:16 AM Negative  Negative   12/02/2018 12:00 AM Negative  Negative   04/28/2018 12:00 AM Negative  Negative   07/03/2017 12:00 AM Negative  Negative   03/26/2017 12:00 AM Negative  Negative   06/10/2016 12:00 AM Negative  Negative   10/24/2015 12:00 AM Negative  Negative   05/10/2015 12:00 AM * Negative    * Negative    Negative  * Negative    * Negative    Negative   08/07/2014 12:00 AM NG: Negative  CT: Negative     Hepatitis B Lab Results  Component Value Date   HEPBSAB REACTIVE (A) 02/17/2023   HEPBSAG NEGATIVE 05/13/2011   HEPBCAB NEG 05/13/2011   Hepatitis C No results found for: HEPCAB, HCVRNAPCRQN Hepatitis A Lab Results  Component Value Date   HAV REACTIVE (A) 02/17/2023   Lipids: Lab Results  Component Value Date   CHOL 233 (H) 02/17/2024   TRIG 145 02/17/2024   HDL 42 02/17/2024   CHOLHDL 5.5 (H) 02/17/2024   VLDL 17 06/10/2016   LDLCALC 162 (H) 02/17/2024    Target Date: The 15th  Assessment: Ryan Martinez presents today for his maintenance  Cabenuva  injections. Past injections were tolerated well without issues. Last HIV RNA was not detected in September. Doing well with no issues today.  Lab work:  Requesting STI testing today. Since getting blood work for syphilis will go ahead and repeat HIV RNA today.  Cabenuva : Administered cabotegravir  600mg /41mL in left upper outer quadrant of the gluteal muscle. Administered rilpivirine  900 mg/3mL in the right upper outer quadrant of the gluteal muscle. No issues with injections. He will follow up in 2 months for next set of injections.  Plan: - Cabenuva  injections administered - HIV RNA, RPR, and oral/urine cytologies today - Next injections scheduled for 08/17/24 with Dr. Luiz and 10/19/24 with me - Call with any issues or questions  Love Chowning L. Esperanza Madrazo, PharmD, BCIDP,  AAHIVP, CPP Clinical Pharmacist Practitioner - Infectious Diseases Clinical Pharmacist Lead - Specialty Pharmacy [facility]     [1]  Allergies Allergen Reactions   Morphine And Codeine Itching and Rash   "

## 2024-06-29 ENCOUNTER — Other Ambulatory Visit (HOSPITAL_COMMUNITY)
Admission: RE | Admit: 2024-06-29 | Discharge: 2024-06-29 | Disposition: A | Discharge status: Home / Self Care | Source: Ambulatory Visit | Attending: Internal Medicine | Admitting: Internal Medicine

## 2024-06-29 ENCOUNTER — Ambulatory Visit: Admitting: Pharmacist

## 2024-06-29 ENCOUNTER — Other Ambulatory Visit: Payer: Self-pay

## 2024-06-29 DIAGNOSIS — B20 Human immunodeficiency virus [HIV] disease: Secondary | ICD-10-CM | POA: Diagnosis not present

## 2024-06-29 DIAGNOSIS — Z113 Encounter for screening for infections with a predominantly sexual mode of transmission: Secondary | ICD-10-CM | POA: Diagnosis present

## 2024-06-29 MED ORDER — CABOTEGRAVIR & RILPIVIRINE ER 600 & 900 MG/3ML IM SUER
1.0000 | Freq: Once | INTRAMUSCULAR | Status: AC
  Administered 2024-06-29: 1 via INTRAMUSCULAR

## 2024-06-30 LAB — CYTOLOGY, (ORAL, ANAL, URETHRAL) ANCILLARY ONLY
Chlamydia: NEGATIVE
Comment: NEGATIVE
Comment: NORMAL
Neisseria Gonorrhea: NEGATIVE

## 2024-06-30 LAB — URINE CYTOLOGY ANCILLARY ONLY
Chlamydia: NEGATIVE
Comment: NEGATIVE
Comment: NORMAL
Neisseria Gonorrhea: NEGATIVE

## 2024-07-01 LAB — RPR TITER: RPR Titer: 1:1 {titer} — ABNORMAL HIGH

## 2024-07-01 LAB — T PALLIDUM AB: T Pallidum Abs: POSITIVE — AB

## 2024-07-01 LAB — SYPHILIS: RPR W/REFLEX TO RPR TITER AND TREPONEMAL ANTIBODIES, TRADITIONAL SCREENING AND DIAGNOSIS ALGORITHM: RPR Ser Ql: REACTIVE — AB

## 2024-07-01 LAB — HIV-1 RNA QUANT-NO REFLEX-BLD
HIV 1 RNA Quant: <20 {copies}/mL — AB
HIV-1 RNA Quant, Log: <1.3 {Log_copies}/mL — AB

## 2024-08-17 ENCOUNTER — Ambulatory Visit: Payer: Self-pay | Admitting: Internal Medicine

## 2024-10-19 ENCOUNTER — Ambulatory Visit: Payer: Self-pay | Admitting: Pharmacist
